# Patient Record
Sex: Male | Born: 2000 | Race: Asian | Hispanic: No | Marital: Single | Smoking: Never smoker
Health system: Southern US, Community
[De-identification: ages and names within clinical notes are randomized; demographics above are authoritative.]

## PROBLEM LIST (undated history)

## (undated) DIAGNOSIS — K219 Gastro-esophageal reflux disease without esophagitis: Secondary | ICD-10-CM

## (undated) HISTORY — PX: TONSILLECTOMY: SUR1361

---
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
## 2018-05-25 IMAGING — DX DG CHEST 2V
2 series · 2 of 2 positions shown · non-contrast
Comparison: None.

CLINICAL DATA: Chest pain

EXAM:
CHEST  2 VIEW

[x chest ap]
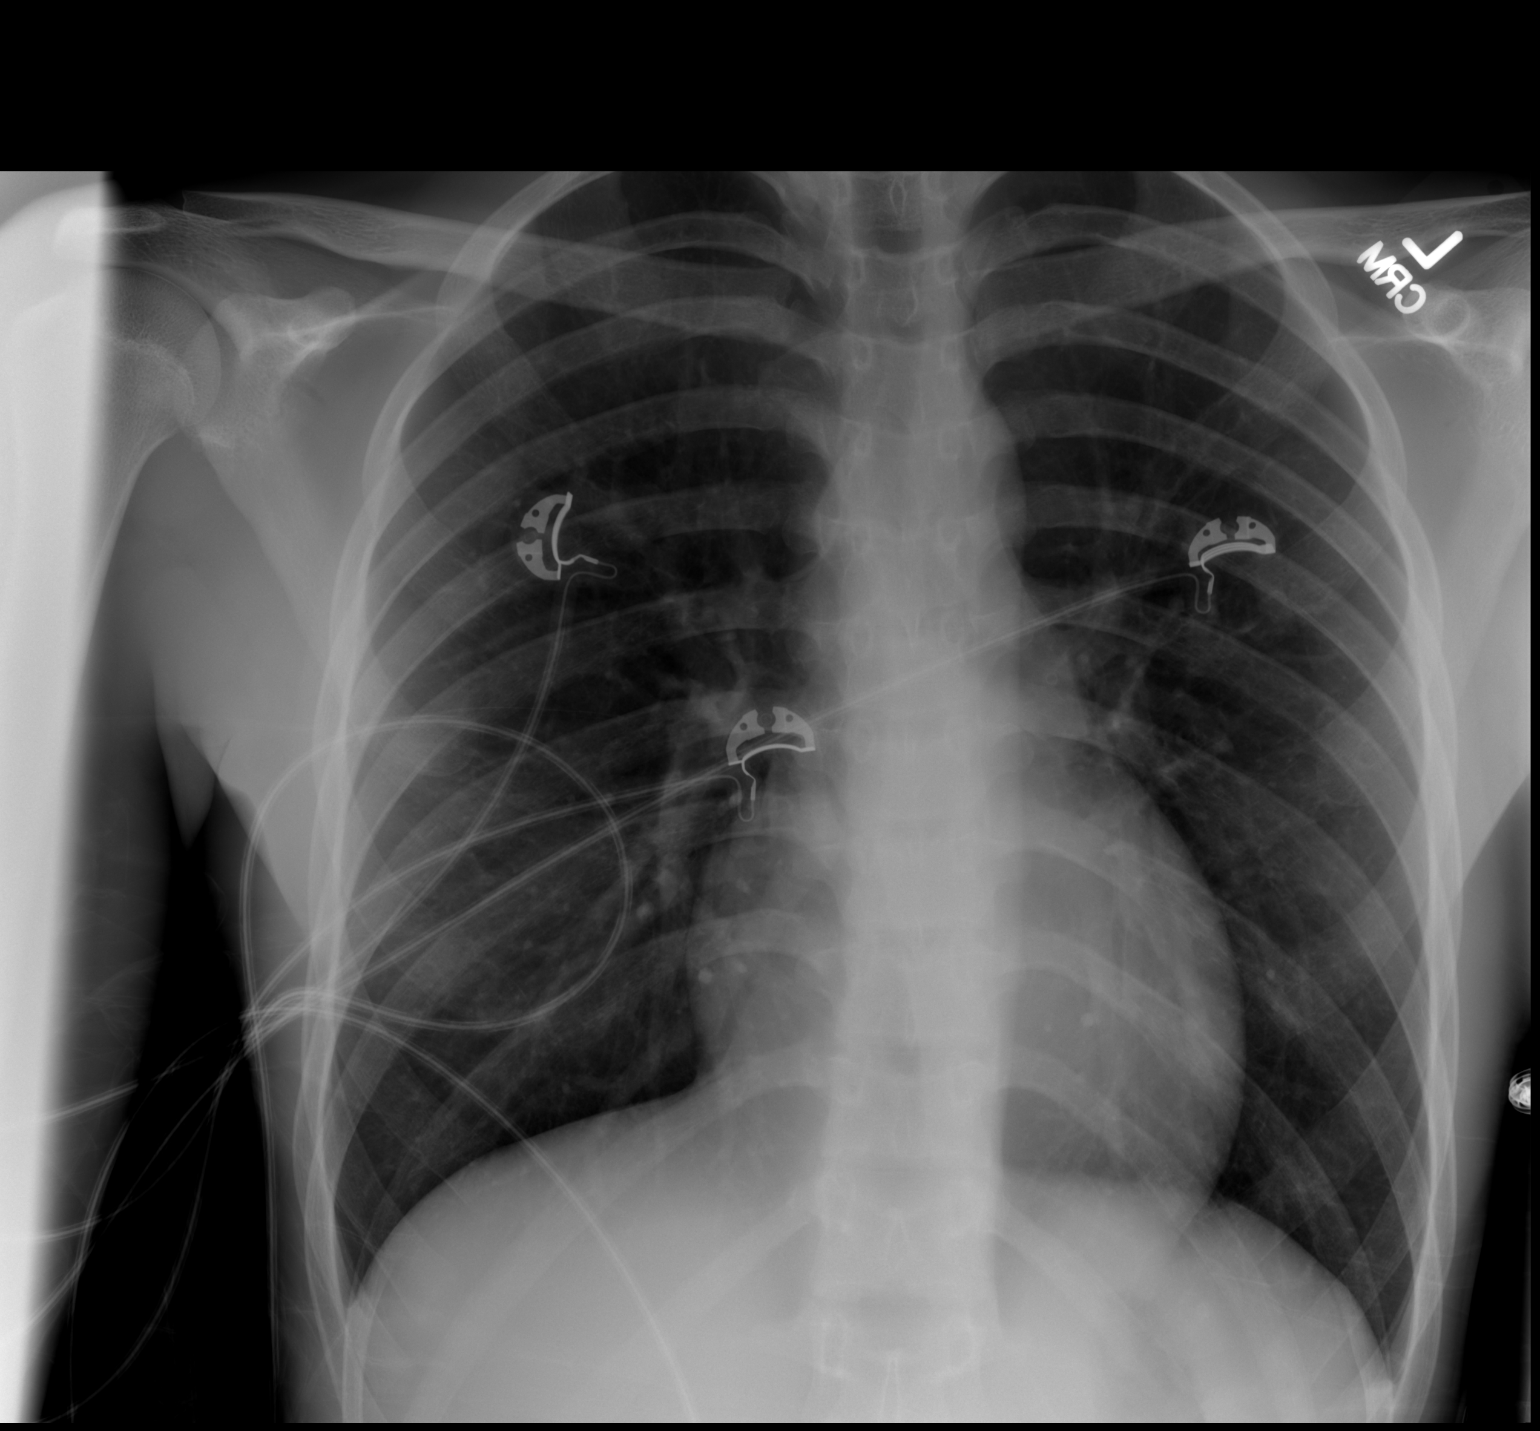

[w chest lat]
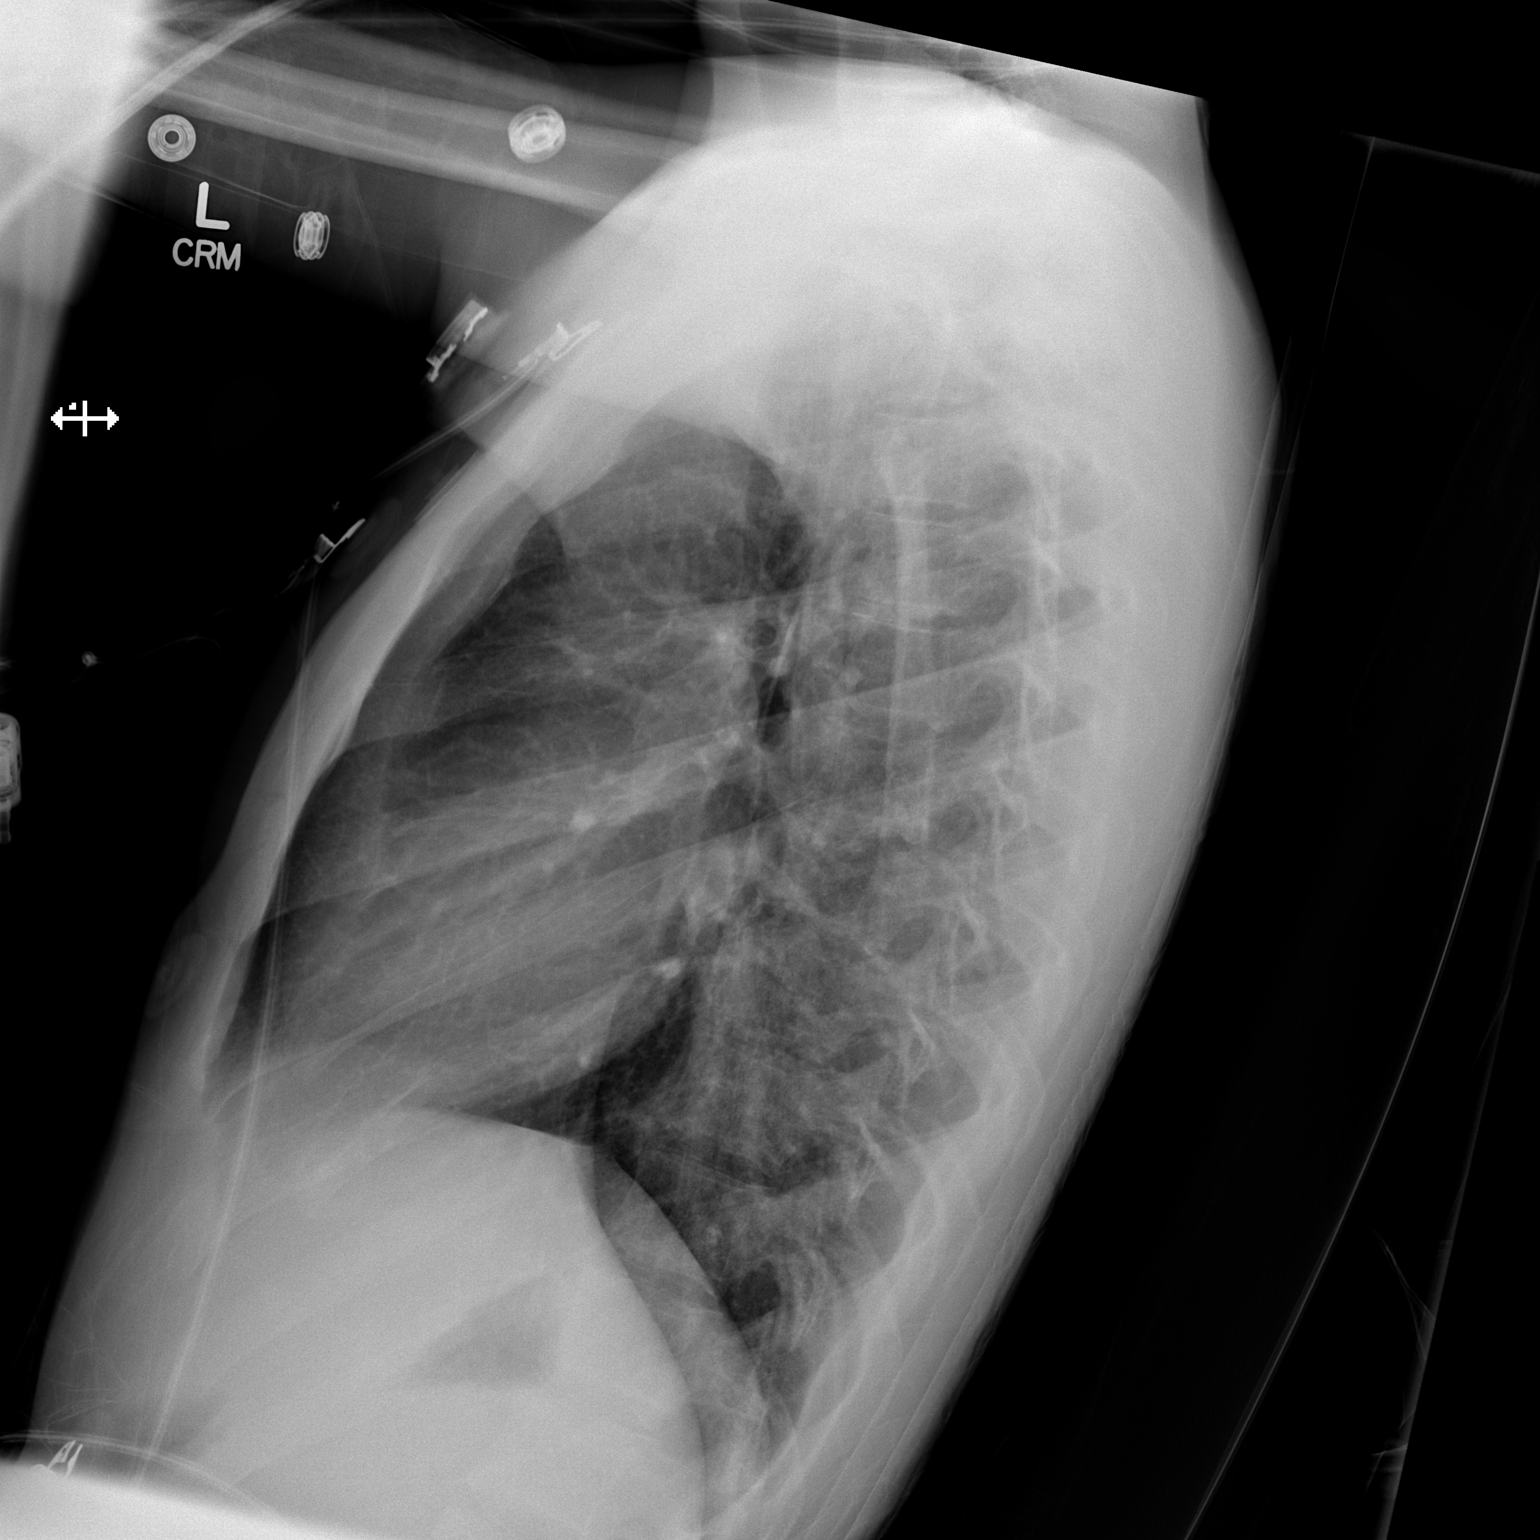

[2 of 2 positions shown; findings below may reference images not displayed]

FINDINGS: The heart size and mediastinal contours are within normal limits.
Both lungs are clear. The visualized skeletal structures are
unremarkable.
IMPRESSION: No active cardiopulmonary disease.

## 2018-07-27 ENCOUNTER — Other Ambulatory Visit: Payer: Self-pay

## 2018-07-27 DIAGNOSIS — Z20822 Contact with and (suspected) exposure to covid-19: Secondary | ICD-10-CM

## 2018-07-31 LAB — SPECIMEN STATUS REPORT

## 2018-07-31 LAB — NOVEL CORONAVIRUS, NAA: SARS-CoV-2, NAA: NOT DETECTED

## 2019-10-23 ENCOUNTER — Ambulatory Visit: Payer: Self-pay | Admitting: Family Medicine

## 2022-06-29 ENCOUNTER — Ambulatory Visit
Admission: EM | Admit: 2022-06-29 | Discharge: 2022-06-29 | Disposition: A | Discharge status: Home / Self Care | Payer: Medicaid Other | Attending: Nurse Practitioner | Admitting: Nurse Practitioner

## 2022-06-29 DIAGNOSIS — B360 Pityriasis versicolor: Secondary | ICD-10-CM

## 2022-06-29 MED ORDER — FLUCONAZOLE 200 MG PO TABS: 200.0000 mg | ORAL_TABLET | ORAL | 0 refills | Status: AC

## 2022-06-29 NOTE — ED Triage Notes (Signed)
Pt c/o rash to back x1 month that is now spreading to chest and BUE. Denies any pain or itching. Has tried ketoconazole cream without relief.

## 2022-06-29 NOTE — Discharge Instructions (Signed)
Start Diflucan once a week for 3 weeks.  You may continue topical ketoconazole cream as needed.  Please follow-up with dermatology for further treatment options.

## 2022-06-29 NOTE — ED Provider Notes (Signed)
[facility]    CSN: [id number] Arrival date & time: 06/29/22  1726      History   Chief Complaint Chief Complaint  Patient presents with   Rash    HPI Craig Odonnell is a 22 y.o. male presents for a rash. Pt reports a 1 month mildly pruritic rash that began on his back but is now traveling to his chest and neck.  States he was seen for this at another clinic and was prescribed ketoconazole cream which she has been using without improvement.  He denies any swelling, fevers, nausea/vomiting, tongue/lip/throat swelling or difficulty breathing or swallowing.  No history of eczema or psoriasis.  Does report history of tinea infections.  No other concerns at this time.   Rash   History reviewed. No pertinent past medical history.  There are no problems to display for this patient.   Past Surgical History:  Procedure Laterality Date   TONSILLECTOMY         Home Medications    Prior to Admission medications   Medication Sig Start Date End Date Taking? Authorizing Provider  fluconazole (DIFLUCAN) 200 MG tablet Take 1 tablet (200 mg total) by mouth once a week for 3 doses. 1 tablet once a week for 3 weeks 06/29/22 07/14/22 Yes Radford Pax, NP  amoxicillin (AMOXIL) 500 MG capsule Take 1 capsule (500 mg total) by mouth 3 (three) times daily. X 10 days Patient not taking: Reported on 10/05/2016 02/07/14   Ria Clock, PA  ibuprofen (ADVIL,MOTRIN) 600 MG tablet Take 1 tablet (600 mg total) by mouth every 6 (six) hours as needed. 10/05/16   Law, Waylan Boga, PA-C  Phenylephrine-Chlorphen-DM 10-09-10.5 MG/5ML LIQD Take 5 mLs by mouth every 4 (four) hours as needed. Take 2.5 ml q 4 h prn cough and cold Patient not taking: Reported on 10/05/2016 10/19/12   Hayden Rasmussen, NP  polyethylene glycol [facility]) packet Take 17 g by mouth daily. Mix in 8 oz of water and drink once daily x 5 days Patient not taking: Reported on 10/05/2016 05/15/13   Presson, Mathis Fare, PA    Family  History Family History  Problem Relation Age of Onset   Cancer Mother     Social History Social History   Tobacco Use   Smoking status: Never   Smokeless tobacco: Never  Vaping Use   Vaping Use: Every day  Substance Use Topics   Alcohol use: No   Drug use: No     Allergies   Patient has no known allergies.   Review of Systems Review of Systems  Skin:  Positive for rash.     Physical Exam Triage Vital Signs ED Triage Vitals  Enc Vitals Group     BP 06/29/22 1750 116/68     Pulse Rate 06/29/22 1748 (!) 51     Resp 06/29/22 1748 18     Temp 06/29/22 1748 97.8 F (36.6 C)     Temp Source 06/29/22 1748 Oral     SpO2 06/29/22 1748 98 %     Weight --      Height --      Head Circumference --      Peak Flow --      Pain Score 06/29/22 1746 0     Pain Loc --      Pain Edu? --      Excl. in GC? --    No data found.  Updated Vital Signs BP 116/68  Pulse (!) 51   Temp 97.8 F (36.6 C) (Oral)   Resp 18   SpO2 98%   Visual Acuity Right Eye Distance:   Left Eye Distance:   Bilateral Distance:    Right Eye Near:   Left Eye Near:    Bilateral Near:     Physical Exam Vitals and nursing note reviewed.  Constitutional:      General: He is not in acute distress.    Appearance: Normal appearance. He is not ill-appearing.  HENT:     Head: Normocephalic and atraumatic.  Eyes:     Pupils: Pupils are equal, round, and reactive to light.  Cardiovascular:     Rate and Rhythm: Normal rate.  Pulmonary:     Effort: Pulmonary effort is normal.  Skin:    General: Skin is warm and dry.     Comments: Mildly erythematous rash with central clearing noted on torso, neck and upper chest.  Neurological:     General: No focal deficit present.     Mental Status: He is alert and oriented to person, place, and time.  Psychiatric:        Mood and Affect: Mood normal.        Behavior: Behavior normal.      UC Treatments / Results  Labs (all labs ordered are listed,  but only abnormal results are displayed) Labs Reviewed - No data to display  EKG   Radiology No results found.  Procedures Procedures (including critical care time)  Medications Ordered in UC Medications - No data to display  Initial Impression / Assessment and Plan / UC Course  I have reviewed the triage vital signs and the nursing notes.  Pertinent labs & imaging results that were available during my care of the patient were reviewed by me and considered in my medical decision making (see chart for details).     Reviewed exam and symptoms with patient.  Discussed rash consistent with tinea infection.  Will start Diflucan once a week for 3 weeks and refer to dermatology.  He may continue topical ketoconazole antifungal cream daily as well.  Strongly encouraged to establish with a PCP for further workup and overall wellbeing.  ER precautions reviewed and patient verbalized understanding. Final Clinical Impressions(s) / UC Diagnoses   Final diagnoses:  Tinea versicolor     Discharge Instructions      Start Diflucan once a week for 3 weeks.  You may continue topical ketoconazole cream as needed.  Please follow-up with dermatology for further treatment options.    ED Prescriptions     Medication Sig Dispense Auth. Provider   fluconazole (DIFLUCAN) 200 MG tablet Take 1 tablet (200 mg total) by mouth once a week for 3 doses. 1 tablet once a week for 3 weeks 3 tablet Radford Pax, NP      PDMP not reviewed this encounter.   Radford Pax, NP 06/29/22 1836

## 2022-08-04 ENCOUNTER — Ambulatory Visit: Payer: Medicaid Other | Admitting: Dermatology

## 2022-10-08 ENCOUNTER — Encounter: Payer: Self-pay | Admitting: Dermatology

## 2022-10-08 ENCOUNTER — Ambulatory Visit: Payer: Medicaid Other | Admitting: Dermatology

## 2022-10-08 DIAGNOSIS — L810 Postinflammatory hyperpigmentation: Secondary | ICD-10-CM | POA: Diagnosis not present

## 2022-10-08 DIAGNOSIS — B360 Pityriasis versicolor: Secondary | ICD-10-CM

## 2022-10-08 DIAGNOSIS — L700 Acne vulgaris: Secondary | ICD-10-CM

## 2022-10-08 MED ORDER — TRETINOIN 0.025 % EX CREA
TOPICAL_CREAM | Freq: Every day | CUTANEOUS | 6 refills | Status: DC
Start: 2022-10-08 — End: 2023-02-04

## 2022-10-08 MED ORDER — KETOCONAZOLE 2 % EX CREA: 1.0000 | TOPICAL_CREAM | Freq: Two times a day (BID) | CUTANEOUS | 4 refills | Status: AC

## 2022-10-08 NOTE — Progress Notes (Signed)
   New Patient Visit   Subjective  Craig Odonnell is a 22 y.o. male who presents for the following: Tinea Versicolor and Acne  Patient states he has rash located at the chest and back that he  would like to have examined. Patient reports the areas have been there for 4 months. He reports the areas are not bothersome.Patient rates irritation 0 out of 10. He states that the areas have spread. Patient reports he  has not previously been treated for these areas. Patient denies Hx of bx. Patient denies family history of skin cancer(s).  The following portions of the chart were reviewed this encounter and updated as appropriate: medications, allergies, medical history  Review of Systems:  No other skin or systemic complaints except as noted in HPI or Assessment and Plan.  Objective  Well appearing patient in no apparent distress; mood and affect are within normal limits.  A focused examination was performed of the following areas: Chest and Back  Relevant exam findings are noted in the Assessment and Plan.    Assessment & Plan   Tinea Versicolor  Flared  - Apply Ketoconazole cream twice a day as needed until clear. Advised it can take many months for affected areas to re pigment - Recommended - Use ketoconazole cream once a month, especially in summer months   Tinea versicolor is a chronic recurrent skin rash causing discolored scaly spots most commonly seen on back, chest, and/or shoulders.  It is generally asymptomatic. The rash is due to overgrowth of a common type of yeast present on everyone's skin and it is not contagious.  It tends to flare more in the summer due to increased sweating on trunk.  After rash is treated, the scaliness will resolve, but the discoloration will take longer to return to normal pigmentation. The periodic use of an OTC medicated soap/shampoo with zinc or selenium sulfide can be helpful to prevent yeast overgrowth and recurrence.  POST-INFLAMMATORY  HYPERPIGMENTATION (PIH) Exam: hyperpigmented macules and/or patches at face   This is a benign condition that comes from having previous inflammation in the skin and will fade with time over months to sometimes years. Recommend daily sun protection including sunscreen SPF 30+ to sun-exposed areas. - Recommend treating any itchy or red areas on the skin quickly to prevent new areas of PIH. Treating with prescription medicines such as hydroquinone may help fade dark spots faster.    Treatment Plan:  - Tretinonin 0.25% to apply nightly (M-W-F)  - We will follow up in 6 months to reassess    No follow-ups on file.    Documentation: I have reviewed the above documentation for accuracy and completeness, and I agree with the above.  Stasia Cavalier, am acting as scribe for Langston Reusing, DO.   Langston Reusing, DO

## 2022-10-08 NOTE — Patient Instructions (Addendum)

## 2022-11-11 ENCOUNTER — Ambulatory Visit: Payer: Medicaid Other | Admitting: Dermatology

## 2022-11-11 ENCOUNTER — Encounter: Payer: Self-pay | Admitting: Dermatology

## 2022-11-11 VITALS — BP 114/76 | HR 60

## 2022-11-11 DIAGNOSIS — D239 Other benign neoplasm of skin, unspecified: Secondary | ICD-10-CM

## 2022-11-11 DIAGNOSIS — B070 Plantar wart: Secondary | ICD-10-CM

## 2022-11-11 MED ORDER — SAFETY SEAL MISCELLANEOUS MISC: 1.0000 | Freq: Every evening | 2 refills | Status: AC

## 2022-11-11 NOTE — Patient Instructions (Addendum)
Hello Twan,  Thank you for visiting my office today. Your proactive approach towards your health is greatly appreciated. Here is a summary of our discussion and the established treatment plan:  - Intradermal Nevus on Nose:   - Diagnosis: Benign intradermal nevus.   - Consultation: Discussed potential removal options and the associated risks of scarring.   - Decision: Deferred removal to monitor for any changes in the future.  - Warts on Right Great Toe:   - Treatment Administered Today: Cryotherapy with liquid nitrogen and Cantharone application.   - Instructions:     - Wash off Cantharone by 3 PM today.     - Apply Aquaphor and a Band-Aid for the next week.     - Start using Wart-Pad medication nightly for six weeks, which will be mailed to you by [facility].  - Follow-Up:   - We will schedule a follow-up appointment in six weeks to assess the treatment progress of the warts.  Please do not hesitate to reach out if you have any questions or concerns regarding your treatment plan.  Warm regards,  Dr. Langston Reusing, Dermatology    Cryotherapy Aftercare  Wash gently with soap and water everyday.   Apply Vaseline and Band-Aid daily until healed.    Important Information   Due to recent changes in healthcare laws, you may see results of your pathology and/or laboratory studies on MyChart before the doctors have had a chance to review them. We understand that in some cases there may be results that are confusing or concerning to you. Please understand that not all results are received at the same time and often the doctors may need to interpret multiple results in order to provide you with the best plan of care or course of treatment. Therefore, we ask that you please give [place] 2 business days to thoroughly review all your results before contacting the office for clarification. Should we see a critical lab result, you will be contacted sooner.     If You Need Anything After  Your Visit   If you have any questions or concerns for your doctor, please call our main line at [phone]. If no one answers, please leave a voicemail as directed and we will return your call as soon as possible. Messages left after 4 pm will be answered the following business day.    You may also send [place] a message via MyChart. We typically respond to MyChart messages within 1-2 business days.  For prescription refills, please ask your pharmacy to contact our office. Our fax number is [phone].  If you have an urgent issue when the clinic is closed that cannot wait until the next business day, you can page your doctor at the number below.     Please note that while we do our best to be available for urgent issues outside of office hours, we are not available 24/7.    If you have an urgent issue and are unable to reach [place], you may choose to seek medical care at your doctor's office, retail clinic, urgent care center, or emergency room.   If you have a medical emergency, please immediately call 911 or go to the emergency department. In the event of inclement weather, please call our main line at [phone] for an update on the status of any delays or closures.  Dermatology Medication Tips: Please keep the boxes that topical medications come in in order to help keep track of the instructions about where and  how to use these. Pharmacies typically print the medication instructions only on the boxes and not directly on the medication tubes.   If your medication is too expensive, please contact our office at [phone] or send [place] a message through MyChart.    We are unable to tell what your co-pay for medications will be in advance as this is different depending on your insurance coverage. However, we may be able to find a substitute medication at lower cost or fill out paperwork to get insurance to cover a needed medication.    If a prior authorization is required to get your medication  covered by your insurance company, please allow [place] 1-2 business days to complete this process.   Drug prices often vary depending on where the prescription is filled and some pharmacies may offer cheaper prices.   The website www.goodrx.com contains coupons for medications through different pharmacies. The prices here do not account for what the cost may be with help from insurance (it may be cheaper with your insurance), but the website can give you the price if you did not use any insurance.  - You can print the associated coupon and take it with your prescription to the pharmacy.  - You may also stop by our office during regular business hours and pick up a GoodRx coupon card.  - If you need your prescription sent electronically to a different pharmacy, notify our office through [facility] or by phone at [phone]

## 2022-11-11 NOTE — Progress Notes (Signed)
   Follow-Up Visit   Subjective  Craig Odonnell is a 22 y.o. male who presents for the following: Bump on Nose & Wart  Patient present today for follow up visit for Bump on Nose. Patient was last evaluated on 10/08/22. Patient reports sxs have  improved . Patient denies medication changes.  Patient has a wart on the right foot. He state the area has been there for about 3 years. He has tried treating with otc wart freezing but nothing has seemed to help.  The following portions of the chart were reviewed this encounter and updated as appropriate: medications, allergies, medical history  Review of Systems:  No other skin or systemic complaints except as noted in HPI or Assessment and Plan.  Objective  Well appearing patient in no apparent distress; mood and affect are within normal limits.  A focused examination was performed of the following areas: Nose and Right foot  Relevant exam findings are noted in the Assessment and Plan.  Right Hallux Medial Paronychium of Toe, Right Hallux Metatarsophalangeal Joint, Right Hallux Proximal Dorsal Toe Verrucous papules        Assessment & Plan   Intradermal NEVI Exam: Tan-brown and/or pink-flesh-colored symmetric papules  Treatment Plan:  - Benign appearing on exam today  - Recommend observation  - Call clinic for new or changing moles - Recommend daily use of broad spectrum spf 30+ sunscreen to sun-exposed areas.   Plantar wart (3) Right Hallux Metatarsophalangeal Joint; Right Hallux Proximal Dorsal Toe; Right Hallux Medial Paronychium of Toe  WART Exam: Verrucous papule(s)  Counseling Discussed viral / HPV (Human Papilloma Virus) etiology and risk of spread /infectivity to other areas of body as well as to other people.  Multiple treatments and methods may be required to clear warts and it is possible treatment may not be successful.  Treatment risks include discoloration; scarring and there is still potential for wart  recurrence.   Destruction of lesion - Right Hallux Medial Paronychium of Toe, Right Hallux Metatarsophalangeal Joint, Right Hallux Proximal Dorsal Toe Complexity: simple   Destruction method: cryotherapy   Informed consent: discussed and consent obtained   Timeout:  patient name, date of birth, surgical site, and procedure verified Lesion destroyed using liquid nitrogen: Yes   Cryotherapy cycles:  4 Post-procedure details: wound care instructions given    Destruction of lesion - Right Hallux Medial Paronychium of Toe, Right Hallux Metatarsophalangeal Joint, Right Hallux Proximal Dorsal Toe  Destruction method: chemical removal   Chemical destruction method: cantharidin   Application time:  15 minutes Outcome: patient tolerated procedure well with no complications   Post-procedure details: wound care instructions given   Additional details:  WASH OFF IN 4 HOURS (3 pm Today)  Related Medications Safety Seal Miscellaneous MISC Apply 1 Application topically at bedtime. Medication Name: G pen  Return in about 6 weeks (around 12/23/2022) for Wart F/U.  Documentation: I have reviewed the above documentation for accuracy and completeness, and I agree with the above.  Stasia Cavalier, am acting as scribe for Langston Reusing, DO.  Langston Reusing, DO

## 2022-12-24 ENCOUNTER — Encounter: Payer: Self-pay | Admitting: Dermatology

## 2022-12-24 ENCOUNTER — Ambulatory Visit: Payer: Medicaid Other | Admitting: Dermatology

## 2022-12-24 VITALS — BP 116/73 | HR 65

## 2022-12-24 DIAGNOSIS — B070 Plantar wart: Secondary | ICD-10-CM | POA: Diagnosis not present

## 2022-12-24 NOTE — Progress Notes (Signed)
   Follow-Up Visit   Subjective  Craig Odonnell is a 22 y.o. male who presents for the following: wart on right toe Pt her for f/u from treatment of wart on his right toe 11/11/22. He was treated with LN2 and cantharidin and told to get a Gpen to treat at home. He has been using the Gpen almost every day. He feels he has had improvement with 1 wart resolved and the other 2 smaller.   The following portions of the chart were reviewed this encounter and updated as appropriate: medications, allergies, medical history  Review of Systems:  No other skin or systemic complaints except as noted in HPI or Assessment and Plan.  Objective  Well appearing patient in no apparent distress; mood and affect are within normal limits.  A focused examination was performed of the following areas: Nose and right toe  Relevant exam findings are noted in the Assessment and Plan.   Exam: verrucous papule(s) on right medial great toe    Assessment & Plan   WART  Counseling Discussed viral / HPV (Human Papilloma Virus) etiology and risk of spread /infectivity to other areas of body as well as to other people.  Multiple treatments and methods may be required to clear warts and it is possible treatment may not be successful.  Treatment risks include discoloration; scarring and there is still potential for wart recurrence.  Treatment Plan: -Continue Gpen nightly and cover   PLANTAR WART (3) Right great toe (3) Destruction of lesion - Right great toe (3)  Destruction method: chemical removal   Chemical destruction method: cantharidin   Procedure instructions: patient instructed to wash and dry area   Procedure instructions comment:  Wash off in 4 hours Related Medications Safety Seal Miscellaneous MISC Apply 1 Application topically at bedtime. Medication Name: G pen  Return in about 6 weeks (around 02/04/2023) for wart f/u.  Owens Shark, CMA, am acting as scribe for [employer], DO.    Documentation: I have reviewed the above documentation for accuracy and completeness, and I agree with the above.  Langston Reusing, DO

## 2022-12-24 NOTE — Patient Instructions (Addendum)
Dear Craig Odonnell,  Thank you for visiting my office today. Your dedication to addressing your dermatological concerns and improving your health is greatly appreciated. Here is a summary of the key instructions from today's consultation:  - Wart Treatment: Continue using the wart pen every night. Ensure to cover the area afterward.  Warts on Right Great Toe:   - We will freeze the area again today.   - Application: Apply cantharidin and leave it on for 4.5 hours before washing it off.   - If the area becomes white and mushy, use an Ducor board to gently remove dead skin.   - Important: Ensure to wash your hands after handling the treated area to prevent spreading the virus.  - Immune Response Enhancement: Start taking cimetidine (over-the-counter, active ingredient in Tagamet) once daily. This is to boost your immune response against the wart virus.   - Duration: Continue this treatment for 3-4 months, and for one additional month after the warts appear to be gone.  - Follow-Up: We have scheduled a follow-up appointment in 4 to 6 weeks to assess progress.  Please ensure to follow these instructions carefully. If you have any questions or concerns, do not hesitate to reach out. Wishing you a merry Christmas and a happy new year!  Warm regards,  Dr. Langston Reusing Dermatology     Important Information   Due to recent changes in healthcare laws, you may see results of your pathology and/or laboratory studies on MyChart before the doctors have had a chance to review them. We understand that in some cases there may be results that are confusing or concerning to you. Please understand that not all results are received at the same time and often the doctors may need to interpret multiple results in order to provide you with the best plan of care or course of treatment. Therefore, we ask that you please give [place] 2 business days to thoroughly review all your results before contacting the office for  clarification. Should we see a critical lab result, you will be contacted sooner.     If You Need Anything After Your Visit   If you have any questions or concerns for your doctor, please call our main line at [phone]. If no one answers, please leave a voicemail as directed and we will return your call as soon as possible. Messages left after 4 pm will be answered the following business day.    You may also send [place] a message via MyChart. We typically respond to MyChart messages within 1-2 business days.  For prescription refills, please ask your pharmacy to contact our office. Our fax number is [phone].  If you have an urgent issue when the clinic is closed that cannot wait until the next business day, you can page your doctor at the number below.     Please note that while we do our best to be available for urgent issues outside of office hours, we are not available 24/7.    If you have an urgent issue and are unable to reach [place], you may choose to seek medical care at your doctor's office, retail clinic, urgent care center, or emergency room.   If you have a medical emergency, please immediately call 911 or go to the emergency department. In the event of inclement weather, please call our main line at [phone] for an update on the status of any delays or closures.  Dermatology Medication Tips: Please keep the boxes that topical medications come  in in order to help keep track of the instructions about where and how to use these. Pharmacies typically print the medication instructions only on the boxes and not directly on the medication tubes.   If your medication is too expensive, please contact our office at [phone] or send [place] a message through MyChart.    We are unable to tell what your co-pay for medications will be in advance as this is different depending on your insurance coverage. However, we may be able to find a substitute medication at lower cost or fill out  paperwork to get insurance to cover a needed medication.    If a prior authorization is required to get your medication covered by your insurance company, please allow [place] 1-2 business days to complete this process.   Drug prices often vary depending on where the prescription is filled and some pharmacies may offer cheaper prices.   The website www.goodrx.com contains coupons for medications through different pharmacies. The prices here do not account for what the cost may be with help from insurance (it may be cheaper with your insurance), but the website can give you the price if you did not use any insurance.  - You can print the associated coupon and take it with your prescription to the pharmacy.  - You may also stop by our office during regular business hours and pick up a GoodRx coupon card.  - If you need your prescription sent electronically to a different pharmacy, notify our office through [facility] or by phone at [phone]

## 2023-02-04 ENCOUNTER — Ambulatory Visit: Payer: Medicaid Other | Admitting: Dermatology

## 2023-02-04 ENCOUNTER — Encounter: Payer: Self-pay | Admitting: Dermatology

## 2023-02-04 VITALS — BP 110/73 | HR 66

## 2023-02-04 DIAGNOSIS — L700 Acne vulgaris: Secondary | ICD-10-CM

## 2023-02-04 DIAGNOSIS — B070 Plantar wart: Secondary | ICD-10-CM

## 2023-02-04 DIAGNOSIS — B079 Viral wart, unspecified: Secondary | ICD-10-CM | POA: Diagnosis not present

## 2023-02-04 DIAGNOSIS — D485 Neoplasm of uncertain behavior of skin: Secondary | ICD-10-CM

## 2023-02-04 DIAGNOSIS — R21 Rash and other nonspecific skin eruption: Secondary | ICD-10-CM

## 2023-02-04 DIAGNOSIS — D492 Neoplasm of unspecified behavior of bone, soft tissue, and skin: Secondary | ICD-10-CM

## 2023-02-04 MED ORDER — TRETINOIN 0.05 % EX CREA
TOPICAL_CREAM | Freq: Every day | CUTANEOUS | 3 refills | Status: AC
Start: 2023-02-04 — End: 2024-02-04

## 2023-02-04 NOTE — Patient Instructions (Addendum)
Hello Craig Odonnell,  Thank you for visiting my office today. Your dedication to addressing your dermatological concerns and improving your health is greatly appreciated. Here is a summary of the key instructions from today's consultation:  Wart Treatment:   G-Pen Usage: Continue applying the G-Pen every night to your feet.   Medication: Take cimetidine as prescribed.   Procedure Today: We applied liquid nitrogen and cantharone. Keep the cantharone on for 4 hours, then wash it off and apply Vaseline.   Post-Treatment: After a week, resume using the G-Pen.  Facial Care:   Tretinoin Adjustment: Increase tretinoin from 0.025 to 0.05. Start with 2 nights a week for 2 weeks, then 3 nights a week. Aim to use it every other night by May or June.   Hyaluronic Acid: Incorporate hyaluronic acid into your routine to mitigate dryness. It is available at [employer]. Apply it after washing your face, before tretinoin, and follow with moisturizer.   Biopsy Site Care: Avoid applying tretinoin on the biopsy site. Use cortisone cream as prescribed to soothe the area.  Biopsy Procedure:   Procedure Performed: A shave biopsy was performed today to investigate the nature of the facial rash.   Aftercare: Apply Aquaphor, keep the area clean, and refrain from using tretinoin on the biopsy site. Apply Aquaphor twice daily.   Results: The results will be communicated via MyChart.  Follow-Up:   Next Appointment: Please schedule a follow-up appointment in 8 weeks to evaluate your foot and tretinoin progress.  Your cooperation and adherence to the treatment plan are crucial for the best outcomes. If you have any questions or concerns before your next visit, please do not hesitate to contact our office.  Warm regards,  Dr. Langston Reusing Dermatology   Patient Handout: Wound Care for Skin Biopsy Site  Taking Care of Your Skin Biopsy Site  Proper care of the biopsy site is essential for promoting healing and minimizing  scarring. This handout provides instructions on how to care for your biopsy site to ensure optimal recovery.  1. Cleaning the Wound:  Clean the biopsy site daily with gentle soap and water. Gently pat the area dry with a clean, soft towel. Avoid harsh scrubbing or rubbing the area, as this can irritate the skin and delay healing.  2. Applying Aquaphor and Bandage:  After cleaning the wound, apply a thin layer of Aquaphor ointment to the biopsy site. Cover the area with a sterile bandage to protect it from dirt, bacteria, and friction. Change the bandage daily or as needed if it becomes soiled or wet.  3. Continued Care for One Week:  Repeat the cleaning, Aquaphor application, and bandaging process daily for one week following the biopsy procedure. Keeping the wound clean and moist during this initial healing period will help prevent infection and promote optimal healing.  4. Massaging Aquaphor into the Area:  ---After one week, discontinue the use of bandages but continue to apply Aquaphor to the biopsy site. ----Gently massage the Aquaphor into the area using circular motions. ---Massaging the skin helps to promote circulation and prevent the formation of scar tissue.   Additional Tips:  Avoid exposing the biopsy site to direct sunlight during the healing process, as this can cause hyperpigmentation or worsen scarring. If you experience any signs of infection, such as increased redness, swelling, warmth, or drainage from the wound, contact your healthcare provider immediately. Follow any additional instructions provided by your healthcare provider for caring for the biopsy site and managing any discomfort. Conclusion:  Taking proper care of your skin biopsy site is crucial for ensuring optimal healing and minimizing scarring. By following these instructions for cleaning, applying Aquaphor, and massaging the area, you can promote a smooth and successful recovery. If you have any  questions or concerns about caring for your biopsy site, don't hesitate to contact your healthcare provider for guidance.    [place] Hyaluronic Acid:         Important Information   Due to recent changes in healthcare laws, you may see results of your pathology and/or laboratory studies on MyChart before the doctors have had a chance to review them. We understand that in some cases there may be results that are confusing or concerning to you. Please understand that not all results are received at the same time and often the doctors may need to interpret multiple results in order to provide you with the best plan of care or course of treatment. Therefore, we ask that you please give [place] 2 business days to thoroughly review all your results before contacting the office for clarification. Should we see a critical lab result, you will be contacted sooner.     If You Need Anything After Your Visit   If you have any questions or concerns for your doctor, please call our main line at [phone]. If no one answers, please leave a voicemail as directed and we will return your call as soon as possible. Messages left after 4 pm will be answered the following business day.    You may also send [place] a message via MyChart. We typically respond to MyChart messages within 1-2 business days.  For prescription refills, please ask your pharmacy to contact our office. Our fax number is [phone].  If you have an urgent issue when the clinic is closed that cannot wait until the next business day, you can page your doctor at the number below.     Please note that while we do our best to be available for urgent issues outside of office hours, we are not available 24/7.    If you have an urgent issue and are unable to reach [place], you may choose to seek medical care at your doctor's office, retail clinic, urgent care center, or emergency room.   If you have a medical emergency, please immediately call 911 or go  to the emergency department. In the event of inclement weather, please call our main line at [phone] for an update on the status of any delays or closures.  Dermatology Medication Tips: Please keep the boxes that topical medications come in in order to help keep track of the instructions about where and how to use these. Pharmacies typically print the medication instructions only on the boxes and not directly on the medication tubes.   If your medication is too expensive, please contact our office at [phone] or send [place] a message through MyChart.    We are unable to tell what your co-pay for medications will be in advance as this is different depending on your insurance coverage. However, we may be able to find a substitute medication at lower cost or fill out paperwork to get insurance to cover a needed medication.    If a prior authorization is required to get your medication covered by your insurance company, please allow [place] 1-2 business days to complete this process.   Drug prices often vary depending on where the prescription is filled and some pharmacies may offer cheaper prices.   The website  www.goodrx.com contains coupons for medications through different pharmacies. The prices here do not account for what the cost may be with help from insurance (it may be cheaper with your insurance), but the website can give you the price if you did not use any insurance.  - You can print the associated coupon and take it with your prescription to the pharmacy.  - You may also stop by our office during regular business hours and pick up a GoodRx coupon card.  - If you need your prescription sent electronically to a different pharmacy, notify our office through [facility] or by phone at [phone]

## 2023-02-04 NOTE — Progress Notes (Signed)
Follow-Up Visit   Subjective  Craig Odonnell is a 23 y.o. male who presents for the following: Wart and Facial irratation  Patient present today for follow up visit for Wart. Patient was last evaluated on 12/24/22. At this visit he was treated with LN2 and cantharidin. He was recommended taking daily cimetidine & prescribed a Gpen to treat at home nightly. He reports he has not been using the Gpen nightly and he has not gotten otc Cimetidine. He feels he has had Patient reports sxs are unchanged. Patient denies medication changes.  He also has concerns about a facial rash that has appeared over the past week. He states the are is itchy and he has not tried to put any medication on the area.   The following portions of the chart were reviewed this encounter and updated as appropriate: medications, allergies, medical history  Review of Systems:  No other skin or systemic complaints except as noted in HPI or Assessment and Plan.  Objective  Well appearing patient in no apparent distress; mood and affect are within normal limits.  A focused examination was performed of the following areas: Right Great Toe and Face  Relevant exam findings are noted in the Assessment and Plan.                    Right Eyebrow Flat top Verrua like papaule with Crusting Right Hallux Medial Paronychium of Toe, Right Hallux Proximal Dorsal Toe Verrucous papules   Assessment & Plan    Plantar Warts on Right Great Toe Assessment: Patient presents with persistent plantar warts on the right great toe. Previous treatments have shown partial success, with one wart resolving but a new one appearing. The stubborn nature of these lesions is noted. Plan: Apply cryotherapy and cantharone to 3 warts on the right great toe. Leave cantharone on for 4 hours, then wash off and apply Vaseline. Continue nightly application of G-Pen (salicylic acid) after 1 week. Take oral cimetidine (dose not specified). Follow up in  8 weeks to reassess foot condition.  Acne Vulgaris Assessment: Patient reports improvement in acne with current tretinoin 0.025% treatment. Mild dryness noted as a side effect.  Plan: Increase tretinoin from 0.025% to 0.05%. Initiate new tretinoin regimen: 2 nights/week for 2 weeks, then 3 nights/week until May/June, then every other night. Incorporate hyaluronic acid into skincare routine (recommend [employer] brand).   Apply hyaluronic acid before tretinoin, followed by moisturizer. Use hyaluronic acid and moisturizer on non-tretinoin nights. Follow up in 8 weeks to assess tretinoin progress.  Facial Rash (Suspected Warts vs Syringomas) Assessment: Patient presents with itchy lesions on face, possibly related to eyebrow threading. Differential diagnosis includes irritation, warts, or syringomas.  Plan: Perform shave biopsy of facial lesion. Avoid applying tretinoin to biopsy area. Apply cortisone cream to affected area (strength and frequency not specified). Biopsy aftercare: keep area clean, apply Aquaphor twice daily. Send biopsy results via MyChart. Discontinue tretinoin use on biopsy site.  NEOPLASM OF UNCERTAIN BEHAVIOR OF SKIN Right Eyebrow Skin / nail biopsy Type of biopsy: tangential   Informed consent: discussed and consent obtained   Timeout: patient name, date of birth, surgical site, and procedure verified   Procedure prep:  Patient was prepped and draped in usual sterile fashion Prep type:  Isopropyl alcohol Anesthesia: the lesion was anesthetized in a standard fashion   Anesthetic:  1% lidocaine w/ epinephrine 1-100,000 buffered w/ 8.4% NaHCO3 Instrument used: DermaBlade   Hemostasis achieved with: aluminum chloride   Outcome:  patient tolerated procedure well   Post-procedure details: sterile dressing applied and wound care instructions given   Dressing type: petrolatum gauze and bandage   Specimen A - Surgical pathology Differential Diagnosis: Wart vs intertrigo vs  Syringoma  Check Margins: No VIRAL WARTS, UNSPECIFIED TYPE (2) Right Hallux Medial Paronychium of Toe, Right Hallux Proximal Dorsal Toe Destruction of lesion - Right Hallux Medial Paronychium of Toe, Right Hallux Proximal Dorsal Toe Complexity: simple   Destruction method: cryotherapy   Informed consent: discussed and consent obtained   Timeout:  patient name, date of birth, surgical site, and procedure verified Lesion destroyed using liquid nitrogen: Yes   Cryotherapy cycles:  2 Post-procedure details: wound care instructions given    Destruction of lesion - Right Hallux Medial Paronychium of Toe, Right Hallux Proximal Dorsal Toe  Destruction method: chemical removal   Chemical destruction method: cantharidin   Application time:  3 seconds Additional details:  Patient instructed to wash off in 3:45 pm  Return in about 8 weeks (around 04/01/2023).  Documentation: I have reviewed the above documentation for accuracy and completeness, and I agree with the above.  Stasia Cavalier, am acting as scribe for Langston Reusing, DO.  Langston Reusing, DO

## 2023-02-09 LAB — SURGICAL PATHOLOGY

## 2023-02-10 ENCOUNTER — Telehealth: Payer: Self-pay

## 2023-02-10 DIAGNOSIS — B078 Other viral warts: Secondary | ICD-10-CM

## 2023-02-10 MED ORDER — IMIQUIMOD 5 % EX CREA: TOPICAL_CREAM | CUTANEOUS | 2 refills | Status: AC

## 2023-02-10 NOTE — Telephone Encounter (Signed)
-----   Message from Delon Lenis sent at 02/09/2023  5:28 PM EST ----- Hi Craig Odonnell,  Please call pt and let him know the bx results came back as a wart.  Please let him know you will be sending over a prescription for imiquimod  5% for him to apply a thin layer to the affected area at bedtime until his next follow up appointment.  Thanks!  Diagnosis Skin , right eyebrow VERRUCA VULGARIS, INFLAMED

## 2023-02-10 NOTE — Telephone Encounter (Signed)
 Pt could not discuss at the time of call. He will call back shortly.   Rx sent.

## 2023-04-01 ENCOUNTER — Ambulatory Visit: Payer: Medicaid Other | Admitting: Dermatology

## 2023-04-01 ENCOUNTER — Encounter: Payer: Self-pay | Admitting: Dermatology

## 2023-04-01 VITALS — BP 133/87

## 2023-04-01 DIAGNOSIS — B079 Viral wart, unspecified: Secondary | ICD-10-CM

## 2023-04-01 NOTE — Patient Instructions (Addendum)
 Wash off cantheroin in 3hours.   Cryotherapy Aftercare  Wash gently with soap and water everyday.   Apply Vaseline and Band-Aid daily until healed.   Important Information  Due to recent changes in healthcare laws, you may see results of your pathology and/or laboratory studies on MyChart before the doctors have had a chance to review them. We understand that in some cases there may be results that are confusing or concerning to you. Please understand that not all results are received at the same time and often the doctors may need to interpret multiple results in order to provide you with the best plan of care or course of treatment. Therefore, we ask that you please give [place] 2 business days to thoroughly review all your results before contacting the office for clarification. Should we see a critical lab result, you will be contacted sooner.   If You Need Anything After Your Visit  If you have any questions or concerns for your doctor, please call our main line at [phone] If no one answers, please leave a voicemail as directed and we will return your call as soon as possible. Messages left after 4 pm will be answered the following business day.   You may also send [place] a message via MyChart. We typically respond to MyChart messages within 1-2 business days.  For prescription refills, please ask your pharmacy to contact our office. Our fax number is [phone].  If you have an urgent issue when the clinic is closed that cannot wait until the next business day, you can page your doctor at the number below.    Please note that while we do our best to be available for urgent issues outside of office hours, we are not available 24/7.   If you have an urgent issue and are unable to reach [place], you may choose to seek medical care at your doctor's office, retail clinic, urgent care center, or emergency room.  If you have a medical emergency, please immediately call 911 or go to the emergency  department. In the event of inclement weather, please call our main line at [phone] for an update on the status of any delays or closures.  Dermatology Medication Tips: Please keep the boxes that topical medications come in in order to help keep track of the instructions about where and how to use these. Pharmacies typically print the medication instructions only on the boxes and not directly on the medication tubes.   If your medication is too expensive, please contact our office at [phone] or send [place] a message through MyChart.   We are unable to tell what your co-pay for medications will be in advance as this is different depending on your insurance coverage. However, we may be able to find a substitute medication at lower cost or fill out paperwork to get insurance to cover a needed medication.   If a prior authorization is required to get your medication covered by your insurance company, please allow [place] 1-2 business days to complete this process.  Drug prices often vary depending on where the prescription is filled and some pharmacies may offer cheaper prices.  The website www.goodrx.com contains coupons for medications through different pharmacies. The prices here do not account for what the cost may be with help from insurance (it may be cheaper with your insurance), but the website can give you the price if you did not use any insurance.  - You can print the associated coupon and take it with  your prescription to the pharmacy.  - You may also stop by our office during regular business hours and pick up a GoodRx coupon card.  - If you need your prescription sent electronically to a different pharmacy, notify our office through [facility] or by phone at [phone]

## 2023-04-01 NOTE — Progress Notes (Signed)
   Follow-Up Visit   Subjective  Craig Odonnell is a 23 y.o. male who presents for the following: wart  Patient present today for follow up visit for . Patient was last evaluated on 02/04/23. At this visit patient was prescribed wart pen & imiquimod cream. Patient reports sxs are better. Patient denies medication changes.  The following portions of the chart were reviewed this encounter and updated as appropriate: medications, allergies, medical history  Review of Systems:  No other skin or systemic complaints except as noted in HPI or Assessment and Plan.  Objective  Well appearing patient in no apparent distress; mood and affect are within normal limits.   A focused examination was performed of the following areas: toe   Relevant exam findings are noted in the Assessment and Plan.       Right Hallux Medial Paronychium of Toe, Right Hallux Proximal Dorsal Toe Verrucous papules   Assessment & Plan   WART Exam: verrucous papule(s)  Counseling Discussed viral / HPV (Human Papilloma Virus) etiology and risk of spread /infectivity to other areas of body as well as to other people.  Multiple treatments and methods may be required to clear warts and it is possible treatment may not be successful.  Treatment risks include discoloration; scarring and there is still potential for wart recurrence.  VIRAL WARTS, UNSPECIFIED TYPE (2) Right Hallux Medial Paronychium of Toe, Right Hallux Proximal Dorsal Toe Destruction of lesion - Right Hallux Medial Paronychium of Toe, Right Hallux Proximal Dorsal Toe Complexity: simple   Destruction method: cryotherapy   Informed consent: discussed and consent obtained   Timeout:  patient name, date of birth, surgical site, and procedure verified Lesion destroyed using liquid nitrogen: Yes   Region frozen until ice ball extended beyond lesion: Yes   Outcome: patient tolerated procedure well with no complications   Post-procedure details: wound care  instructions given    Destruction of lesion - Right Hallux Medial Paronychium of Toe, Right Hallux Proximal Dorsal Toe Complexity: simple   Destruction method: chemical removal   Informed consent: discussed and consent obtained   Timeout:  patient name, date of birth, surgical site, and procedure verified Chemical destruction method: cantharidin   Procedure instructions: patient instructed to wash and dry area   Outcome: patient tolerated procedure well with no complications   Post-procedure details: sterile dressing applied and wound care instructions given   Dressing type: bandage    Return for 6-8weeks for wart f/u.   Documentation: I have reviewed the above documentation for accuracy and completeness, and I agree with the above.  I, Shirron Marcha Solders, CMA, am acting as scribe for [employer], DO.   Langston Reusing, DO

## 2023-05-13 ENCOUNTER — Ambulatory Visit: Admitting: Dermatology

## 2023-06-23 ENCOUNTER — Ambulatory Visit: Admitting: Dermatology

## 2023-07-21 ENCOUNTER — Ambulatory Visit: Attending: Physician Assistant | Admitting: Physical Therapy

## 2023-07-21 ENCOUNTER — Other Ambulatory Visit: Payer: Self-pay

## 2023-07-21 ENCOUNTER — Encounter: Payer: Self-pay | Admitting: Physical Therapy

## 2023-07-21 DIAGNOSIS — M542 Cervicalgia: Secondary | ICD-10-CM | POA: Diagnosis present

## 2023-07-21 DIAGNOSIS — M6281 Muscle weakness (generalized): Secondary | ICD-10-CM | POA: Insufficient documentation

## 2023-07-21 DIAGNOSIS — R293 Abnormal posture: Secondary | ICD-10-CM | POA: Diagnosis present

## 2023-07-21 NOTE — Therapy (Signed)
 OUTPATIENT PHYSICAL THERAPY CERVICAL EVALUATION   Patient Name: Craig Odonnell MRN: [id number] DOB:2000-10-15, 23 y.o., male Today's Date: 07/21/2023  END OF SESSION:  PT End of Session - 07/21/23 1522     Visit Number 1    Number of Visits 13    Date for PT Re-Evaluation 09/01/23    Authorization Type [employer]    Authorization Time Period 07/21/23 to 09/01/23    PT Start Time 1519    PT Stop Time 1553    PT Time Calculation (min) 34 min    Activity Tolerance Patient tolerated treatment well    Behavior During Therapy [facility] for tasks assessed/performed          History reviewed. No pertinent past medical history. Past Surgical History:  Procedure Laterality Date   TONSILLECTOMY     There are no active problems to display for this patient.   PCP: no PCP  REFERRING PROVIDER: Kline, Chianne, PA-C  REFERRING DIAG: M54.2 (ICD-10-CM) - Cervicalgia  THERAPY DIAG:  Cervicalgia  Abnormal posture  Muscle weakness (generalized)  Rationale for Evaluation and Treatment: Rehabilitation  ONSET DATE: late May 2025  SUBJECTIVE:                                                                                                                                                                                                         SUBJECTIVE STATEMENT:  Not sure what's going with the neck, shoulders were hurting too. Doctor recommended I come to PT. Its generally getting better. Could be because of my posture too. L shoulder and hand don't feel quite as strong as the right.   Hand dominance: Right  PERTINENT HISTORY:  See above   PAIN:  Are you having pain? Yes: NPRS scale: 4-5/10 now, in the past got up to 8-9/10 Pain location: neck  Pain description: aching  Aggravating factors: not sure Relieving factors: fixing posture a bit   PRECAUTIONS: None  RED FLAGS: None     WEIGHT BEARING RESTRICTIONS: No  FALLS:  Has patient fallen in last 6 months? 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    PATIENT SURVEYS:  NDI: 7/50   COGNITION: Overall cognitive status: Within  functional limits for tasks assessed  SENSATION: Not tested  POSTURE: rounded shoulders, forward head, and increased thoracic kyphosis  PALPATION:  Sore in B suboccipitals, thoracic paraspinals, B upper traps; noted B scapular winging at rest    CERVICAL ROM:   Active ROM A/PROM (deg) eval  Flexion WNL   Extension 25% limited   Right lateral flexion 25% limited   Left lateral flexion 25% limited   Right rotation WNL   Left rotation WNL    (Blank rows = not tested)  Thoracic AROM: flexion WNL, extension WNL, lateral flexion WNL B, rotation B WNL   UPPER EXTREMITY ROM:  Active ROM Right eval Left eval  Shoulder flexion Full  Full  Shoulder extension    Shoulder abduction Full  Full   Shoulder adduction    Shoulder extension    Shoulder internal rotation WNL  WNL   Shoulder external rotation WNL  WNL   Elbow flexion    Elbow extension    Wrist flexion    Wrist extension    Wrist ulnar deviation    Wrist radial deviation    Wrist pronation    Wrist supination     (Blank rows = not tested)  UPPER EXTREMITY MMT:  MMT Right eval Left eval  Shoulder flexion 4+ 4+  Shoulder extension    Shoulder abduction 4+ 4+  Shoulder adduction    Shoulder extension 4- 4-  Shoulder internal rotation    Shoulder external rotation    Middle trapezius 3+ 3+  Lower trapezius    Elbow flexion    Elbow extension    Wrist flexion    Wrist extension    Wrist ulnar deviation    Wrist radial  deviation    Wrist pronation    Wrist supination    Grip strength     (Blank rows = not tested)     TREATMENT DATE:   07/21/23  Eval, POC, HEP   UBE L3.5 x6 minutes backwards only                                                                                                                                   PATIENT EDUCATION:  Education details: exam findings, POC, HEP  Person educated: Patient Education method: Programmer, multimedia, Facilities manager, and Handouts Education comprehension: verbalized understanding, returned demonstration, and needs further education  HOME EXERCISE PROGRAM: Access Code: [email or web address] URL: [email or web address] Date: 07/21/2023 Prepared by: Josette Rough  Exercises - Seated Cervical Retraction  - 1 x daily - 7 x weekly - 2 sets - 10 reps - 3 seconds  hold - Seated Upper Trapezius Stretch  - 1 x daily - 7 x weekly - 2 sets - 6 reps - 30 seconds  hold - Scapular Retraction with Resistance  - 1 x daily - 7 x weekly - 2 sets - 10 reps - 1 second  hold - Shoulder extension with resistance - Neutral  -  1 x daily - 7 x weekly - 2 sets - 10 reps - 1 second  hold  ASSESSMENT:  CLINICAL IMPRESSION: Patient is a 23 y.o. M who was seen today for physical therapy evaluation and treatment for M54.2 (ICD-10-CM) - Cervicalgia. Does demonstrate muscle strength and flexibility imbalance which is likely leading to neck pain. He has been working on fixing his posture on his own,and pain has started to improve some. Endorses L UE feeling weaker than the right since pain started, no signs of radiculopathy noted. Anticipate he will respond well to PT.    OBJECTIVE IMPAIRMENTS: decreased ROM, decreased strength, increased muscle spasms, impaired UE functional use, improper body mechanics, postural dysfunction, and pain.   ACTIVITY LIMITATIONS: carrying, lifting, and reach over head  PARTICIPATION LIMITATIONS: meal prep, cleaning, laundry, driving, shopping,  community activity, occupation, and yard work  PERSONAL FACTORS: Age, Behavior pattern, Education, Past/current experiences, Social background, and Time since onset of injury/illness/exacerbation are also affecting patient's functional outcome.   REHAB POTENTIAL: Excellent  CLINICAL DECISION MAKING: Stable/uncomplicated  EVALUATION COMPLEXITY: Low   GOALS: Goals reviewed with patient? No  SHORT TERM GOALS: Target date: 08/04/2023    Will be compliant with appropriate progressive HEP  Baseline:  Goal status: INITIAL  2.  Will demonstrate improved functional posture with all tasks  Baseline:  Goal status: INITIAL   LONG TERM GOALS: Target date: 09/01/2023    Cervical ROM to have normalized  Baseline:  Goal status: INITIAL  2.  MMT to be 5/5 in all tested groups bilaterally  Baseline:  Goal status: INITIAL  3.  Cervical pain to have resolved  Baseline:  Goal status: INITIAL  4.  NDI to show 0% functionally impaired  Baseline:  Goal status: INITIAL    PLAN:  PT FREQUENCY: 2x/week  PT DURATION: 6 weeks  PLANNED INTERVENTIONS: 97750- Physical Performance Testing, 97110-Therapeutic exercises, 97530- Therapeutic activity, [id number]- Neuromuscular re-education, 97535- Self Care, and [id number]- Manual therapy  PLAN FOR NEXT SESSION: postural retraining and strengthening, PRE as tolerated   Josette Rough, PT, DPT 07/21/23 3:57 PM

## 2023-08-10 ENCOUNTER — Ambulatory Visit: Attending: Physician Assistant | Admitting: Physical Therapy

## 2023-08-10 DIAGNOSIS — R293 Abnormal posture: Secondary | ICD-10-CM | POA: Insufficient documentation

## 2023-08-10 DIAGNOSIS — M6281 Muscle weakness (generalized): Secondary | ICD-10-CM | POA: Insufficient documentation

## 2023-08-10 DIAGNOSIS — M542 Cervicalgia: Secondary | ICD-10-CM | POA: Insufficient documentation

## 2023-08-12 ENCOUNTER — Encounter: Admitting: Physical Therapy

## 2023-08-16 ENCOUNTER — Encounter: Payer: Self-pay | Admitting: Physical Therapy

## 2023-08-16 ENCOUNTER — Ambulatory Visit: Admitting: Physical Therapy

## 2023-08-16 DIAGNOSIS — M6281 Muscle weakness (generalized): Secondary | ICD-10-CM | POA: Diagnosis present

## 2023-08-16 DIAGNOSIS — R293 Abnormal posture: Secondary | ICD-10-CM | POA: Diagnosis present

## 2023-08-16 DIAGNOSIS — M542 Cervicalgia: Secondary | ICD-10-CM | POA: Diagnosis present

## 2023-08-16 NOTE — Therapy (Signed)
 OUTPATIENT PHYSICAL THERAPY CERVICAL TREATMENT   Patient Name: Craig Odonnell MRN: [id number] DOB:[birth year], 23 y.o., male Today's Date: 08/16/2023  END OF SESSION:  PT End of Session - 08/16/23 1104     Visit Number 2    Date for PT Re-Evaluation 09/01/23    PT Start Time 1104    PT Stop Time 1145    PT Time Calculation (min) 41 min    Activity Tolerance Patient tolerated treatment well    Behavior During Therapy [facility] for tasks assessed/performed          History reviewed. No pertinent past medical history. Past Surgical History:  Procedure Laterality Date   TONSILLECTOMY     There are no active problems to display for this patient.   PCP: no PCP  REFERRING PROVIDER: Kline, Chianne, PA-C  REFERRING DIAG: M54.2 (ICD-10-CM) - Cervicalgia  THERAPY DIAG:  Abnormal posture  Muscle weakness (generalized)  Cervicalgia  Rationale for Evaluation and Treatment: Rehabilitation  ONSET DATE: late May 2025  SUBJECTIVE:                                                                                                                                                                                                         SUBJECTIVE STATEMENT:  Doing all right but needs to work on his posture. Lower back hurts all the time. Works out  at he gym 5 to 6 days a week  Hand dominance: Right  PERTINENT HISTORY:  See above   PAIN:  Are you having pain? Yes: NPRS scale: 6/10 Pain location: neck  Pain description: aching  Aggravating factors: not sure Relieving factors: fixing posture a bit   PRECAUTIONS: None  RED FLAGS: None     WEIGHT BEARING RESTRICTIONS: No  FALLS:  Has patient fallen in last 6 months? 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    PATIENT SURVEYS:  NDI: 7/50   COGNITION: Overall cognitive status: Within functional limits for tasks assessed  SENSATION: Not tested  POSTURE: rounded shoulders, forward head, and increased thoracic kyphosis  PALPATION:  Sore in B suboccipitals, thoracic paraspinals, B upper traps; noted B scapular winging at rest    CERVICAL ROM:   Active  ROM A/PROM (deg) eval  Flexion WNL   Extension 25% limited   Right lateral flexion 25% limited   Left lateral flexion 25% limited   Right rotation WNL   Left rotation WNL    (Blank rows = not tested)  Thoracic AROM: flexion WNL, extension WNL, lateral flexion WNL B, rotation B WNL   UPPER EXTREMITY ROM:  Active ROM Right eval Left eval  Shoulder flexion Full  Full  Shoulder extension    Shoulder abduction Full  Full   Shoulder adduction    Shoulder extension    Shoulder internal rotation WNL  WNL   Shoulder external rotation WNL  WNL   Elbow flexion    Elbow extension    Wrist flexion    Wrist extension    Wrist ulnar deviation    Wrist radial deviation    Wrist pronation    Wrist supination     (Blank rows = not tested)  UPPER EXTREMITY MMT:  MMT Right eval Left eval  Shoulder flexion 4+ 4+  Shoulder extension    Shoulder abduction 4+ 4+  Shoulder adduction    Shoulder extension 4- 4-  Shoulder internal rotation    Shoulder external rotation    Middle trapezius 3+ 3+  Lower trapezius    Elbow flexion    Elbow extension    Wrist flexion    Wrist extension    Wrist ulnar deviation    Wrist radial deviation    Wrist pronation    Wrist supination    Grip strength     (Blank rows = not tested)     TREATMENT DATE:  08/16/23 UBE L2 x 2 min each Bike L3 x 3 min Rows & Lats 35lb 2x10 Hips Ext 10lb 2x10  AR press 20lb x 10 each Sit to stand  yellow ball 2x10 Bridges x10 LE on pball bridges, K2C, Oblq  Passive stretching HS, piriformis, ITB    07/21/23  Eval, POC, HEP   UBE L3.5 x6 minutes backwards only                                                                                                                                   PATIENT EDUCATION:  Education details: exam findings, POC, HEP  Person educated: Patient Education method: Programmer, multimedia, Facilities manager, and Handouts Education comprehension: verbalized understanding, returned demonstration, and needs further education  HOME EXERCISE PROGRAM: Access Code: [email or web address] URL: [email or web address] Date: 07/21/2023 Prepared by: Josette Rough  Exercises - Seated Cervical Retraction  - 1 x daily - 7 x weekly - 2 sets - 10 reps - 3 seconds  hold - Seated Upper Trapezius Stretch  - 1 x daily - 7 x weekly - 2 sets - 6 reps - 30 seconds  hold - Scapular Retraction with Resistance  - 1 x daily - 7 x weekly - 2 sets - 10 reps - 1 second  hold - Shoulder extension with  resistance - Neutral  - 1 x daily - 7 x weekly - 2 sets - 10 reps - 1 second  hold  ASSESSMENT:  CLINICAL IMPRESSION: Patient is a 23 y.o. M who was seen today for physical therapy evaluation and treatment for M54.2 (ICD-10-CM) - Cervicalgia. All interventions completed well despite high pain ratig. Less pain reported as session progressed.  He reports being more aware of his posture, admitting his work ergonomics needed's to be improved. dicussed appropriate step ups at work while at computer.   OBJECTIVE IMPAIRMENTS: decreased ROM, decreased strength, increased muscle spasms, impaired UE functional use, improper body mechanics, postural dysfunction, and pain.   ACTIVITY LIMITATIONS: carrying, lifting, and reach over head  PARTICIPATION LIMITATIONS: meal prep, cleaning, laundry, driving, shopping, community activity, occupation, and yard work  PERSONAL FACTORS: Age, Behavior pattern,  Education, Past/current experiences, Social background, and Time since onset of injury/illness/exacerbation are also affecting patient's functional outcome.   REHAB POTENTIAL: Excellent  CLINICAL DECISION MAKING: Stable/uncomplicated  EVALUATION COMPLEXITY: Low   GOALS: Goals reviewed with patient? No  SHORT TERM GOALS: Target date: 08/04/2023    Will be compliant with appropriate progressive HEP  Baseline:  Goal status: Ongoing 08/16/23  2.  Will demonstrate improved functional posture with all tasks  Baseline:  Goal status: INITIAL   LONG TERM GOALS: Target date: 09/01/2023    Cervical ROM to have normalized  Baseline:  Goal status: INITIAL  2.  MMT to be 5/5 in all tested groups bilaterally  Baseline:  Goal status: INITIAL  3.  Cervical pain to have resolved  Baseline:  Goal status: ongoing 08/16/23  4.  NDI to show 0% functionally impaired  Baseline:  Goal status: INITIAL    PLAN:  PT FREQUENCY: 2x/week  PT DURATION: 6 weeks  PLANNED INTERVENTIONS: 97750- Physical Performance Testing, 97110-Therapeutic exercises, 97530- Therapeutic activity, [id number]- Neuromuscular re-education, 97535- Self Care, and [id number]- Manual therapy  PLAN FOR NEXT SESSION: postural retraining and strengthening, PRE as tolerated   Josette Rough, PT, DPT 08/16/23 11:04 AM

## 2023-08-17 ENCOUNTER — Encounter: Admitting: Physical Therapy

## 2023-08-18 NOTE — Therapy (Signed)
 OUTPATIENT PHYSICAL THERAPY CERVICAL TREATMENT   Patient Name: Craig Odonnell MRN: [id number] DOB:Sep 20, 2000, 23 y.o., male Today's Date: 08/19/2023  END OF SESSION:  PT End of Session - 08/19/23 1150     Visit Number 3    Date for PT Re-Evaluation 09/01/23    PT Start Time 1150    PT Stop Time 1225    PT Time Calculation (min) 35 min    Activity Tolerance Patient tolerated treatment well    Behavior During Therapy [facility] for tasks assessed/performed           History reviewed. No pertinent past medical history. Past Surgical History:  Procedure Laterality Date   TONSILLECTOMY     There are no active problems to display for this patient.   PCP: no PCP  REFERRING PROVIDER: Kline, Chianne, PA-C  REFERRING DIAG: M54.2 (ICD-10-CM) - Cervicalgia  THERAPY DIAG:  Abnormal posture  Muscle weakness (generalized)  Cervicalgia  Rationale for Evaluation and Treatment: Rehabilitation  ONSET DATE: late May 2025  SUBJECTIVE:                                                                                                                                                                                                         SUBJECTIVE STATEMENT:  I am all good.   Hand dominance: Right  PERTINENT HISTORY:  See above   PAIN:  Are you having pain? Yes: NPRS scale: 6/10 Pain location: neck  Pain description: aching  Aggravating factors: not sure Relieving factors: fixing posture a bit   PRECAUTIONS: None  RED FLAGS: None     WEIGHT BEARING RESTRICTIONS: No  FALLS:  Has patient fallen in last 6 months? 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    PATIENT SURVEYS:  NDI: 7/50   COGNITION: Overall cognitive status: Within functional limits for tasks assessed  SENSATION: Not tested  POSTURE: rounded shoulders, forward head, and increased thoracic kyphosis  PALPATION:  Sore in B suboccipitals, thoracic paraspinals, B upper traps; noted B scapular winging at rest    CERVICAL ROM:   Active ROM A/PROM (deg) eval  Flexion WNL   Extension 25% limited   Right lateral flexion 25% limited   Left  lateral flexion 25% limited   Right rotation WNL   Left rotation WNL    (Blank rows = not tested)  Thoracic AROM: flexion WNL, extension WNL, lateral flexion WNL B, rotation B WNL   UPPER EXTREMITY ROM:  Active ROM Right eval Left eval  Shoulder flexion Full  Full  Shoulder extension    Shoulder abduction Full  Full   Shoulder adduction    Shoulder extension    Shoulder internal rotation WNL  WNL   Shoulder external rotation WNL  WNL   Elbow flexion    Elbow extension    Wrist flexion    Wrist extension    Wrist ulnar deviation    Wrist radial deviation    Wrist pronation    Wrist supination     (Blank rows = not tested)  UPPER EXTREMITY MMT:  MMT Right eval Left eval  Shoulder flexion 4+ 4+  Shoulder extension    Shoulder abduction 4+ 4+  Shoulder adduction    Shoulder extension 4- 4-  Shoulder internal rotation    Shoulder external rotation    Middle trapezius 3+ 3+  Lower trapezius    Elbow flexion    Elbow extension    Wrist flexion    Wrist extension    Wrist ulnar deviation    Wrist radial deviation    Wrist pronation    Wrist supination    Grip strength     (Blank rows = not tested)     TREATMENT DATE:  08/19/23 Shoulder extension 10# 2x12 Rows and lats 35# 2x12 BlackTB extension 2x10 UBE L3 x58mins  STS with OHP blue ball 2x10 4# shoulder shrugs 2x10 4# UT stretch Horizontal abd green 2x10  08/16/23 UBE L2 x 2 min each Bike  L3 x 3 min Rows & Lats 35lb 2x10 Hips Ext 10lb 2x10  AR press 20lb x 10 each Sit to stand yellow ball 2x10 Bridges x10 LE on pball bridges, K2C, Oblq  Passive stretching HS, piriformis, ITB    07/21/23  Eval, POC, HEP   UBE L3.5 x6 minutes backwards only                                                                                                                                   PATIENT EDUCATION:  Education details: exam findings, POC, HEP  Person educated: Patient Education method: Explanation, Demonstration, and Handouts Education comprehension: verbalized understanding, returned demonstration, and needs further education  HOME EXERCISE PROGRAM: Access Code: [email or web address] URL: [email or web address] Date: 07/21/2023 Prepared by: Josette Rough  Exercises - Seated Cervical Retraction  - 1 x daily - 7 x weekly - 2 sets - 10 reps - 3 seconds  hold - Seated Upper Trapezius Stretch  - 1 x daily - 7 x weekly - 2 sets - 6 reps - 30 seconds  hold - Scapular Retraction with Resistance  - 1 x daily - 7 x weekly - 2  sets - 10 reps - 1 second  hold - Shoulder extension with resistance - Neutral  - 1 x daily - 7 x weekly - 2 sets - 10 reps - 1 second  hold  ASSESSMENT:  CLINICAL IMPRESSION: Patient returns with no pain and reports he feels better overall. He states he was having pain but by the time he got the referral to come to PT he was already starting to feel better. Today we continued with some strengthening and talked about ergonomics at work and at the gym to remain pain free.  OBJECTIVE IMPAIRMENTS: decreased ROM, decreased strength, increased muscle spasms, impaired UE functional use, improper body mechanics, postural dysfunction, and pain.   ACTIVITY LIMITATIONS: carrying, lifting, and reach over head  PARTICIPATION LIMITATIONS: meal prep, cleaning, laundry, driving, shopping, community activity, occupation, and yard work  PERSONAL FACTORS: Age, Behavior  pattern, Education, Past/current experiences, Social background, and Time since onset of injury/illness/exacerbation are also affecting patient's functional outcome.   REHAB POTENTIAL: Excellent  CLINICAL DECISION MAKING: Stable/uncomplicated  EVALUATION COMPLEXITY: Low   GOALS: Goals reviewed with patient? No  SHORT TERM GOALS: Target date: 08/04/2023    Will be compliant with appropriate progressive HEP  Baseline:  Goal status: Ongoing 08/16/23, MET 08/19/23  2.  Will demonstrate improved functional posture with all tasks  Baseline:  Goal status: MET 08/19/23   LONG TERM GOALS: Target date: 09/01/2023    Cervical ROM to have normalized  Baseline:  Goal status: MET 08/19/23  2.  MMT to be 5/5 in all tested groups bilaterally  Baseline:  Goal status: 5/5 MET 08/19/23  3.  Cervical pain to have resolved  Baseline:  Goal status: ongoing 08/16/23, MET 08/19/23  4.  NDI to show 0% functionally impaired  Baseline:  Goal status: MET 08/19/23    PLAN:  PT FREQUENCY: 2x/week  PT DURATION: 6 weeks  PLANNED INTERVENTIONS: 97750- Physical Performance Testing, 97110-Therapeutic exercises, 97530- Therapeutic activity, 97112- Neuromuscular re-education, 97535- Self Care, and 02859- Manual therapy  PLAN FOR NEXT SESSION: d/c  PHYSICAL THERAPY DISCHARGE SUMMARY  Visits from Start of Care: 3   Patient agrees to discharge. Patient goals were met. Patient is being discharged due to being pleased with the current functional level.  Almetta Fam, [place], DPT 08/19/23 12:24 PM

## 2023-08-19 ENCOUNTER — Ambulatory Visit

## 2023-08-19 ENCOUNTER — Encounter: Admitting: Physical Therapy

## 2023-08-19 DIAGNOSIS — R293 Abnormal posture: Secondary | ICD-10-CM | POA: Diagnosis not present

## 2023-08-19 DIAGNOSIS — M542 Cervicalgia: Secondary | ICD-10-CM

## 2023-08-19 DIAGNOSIS — M6281 Muscle weakness (generalized): Secondary | ICD-10-CM

## 2023-08-24 ENCOUNTER — Encounter: Admitting: Physical Therapy

## 2023-08-26 ENCOUNTER — Ambulatory Visit: Admitting: Physical Therapy

## 2023-08-31 ENCOUNTER — Ambulatory Visit: Admitting: Physical Therapy

## 2023-09-01 ENCOUNTER — Ambulatory Visit: Admitting: Dermatology

## 2023-09-02 ENCOUNTER — Encounter: Admitting: Physical Therapy

## 2023-09-07 ENCOUNTER — Ambulatory Visit: Admitting: Physical Therapy

## 2023-09-09 ENCOUNTER — Ambulatory Visit: Admitting: Physical Therapy

## 2023-09-20 ENCOUNTER — Ambulatory Visit (INDEPENDENT_AMBULATORY_CARE_PROVIDER_SITE_OTHER)

## 2023-09-20 ENCOUNTER — Ambulatory Visit (HOSPITAL_BASED_OUTPATIENT_CLINIC_OR_DEPARTMENT_OTHER): Admitting: Student

## 2023-09-20 ENCOUNTER — Ambulatory Visit
Admission: EM | Admit: 2023-09-20 | Discharge: 2023-09-20 | Disposition: A | Discharge status: Home / Self Care | Attending: Family Medicine | Admitting: Family Medicine

## 2023-09-20 ENCOUNTER — Other Ambulatory Visit: Payer: Self-pay

## 2023-09-20 ENCOUNTER — Ambulatory Visit: Payer: Self-pay | Admitting: Nurse Practitioner

## 2023-09-20 DIAGNOSIS — S62025A Nondisplaced fracture of middle third of navicular [scaphoid] bone of left wrist, initial encounter for closed fracture: Secondary | ICD-10-CM

## 2023-09-20 HISTORY — DX: Gastro-esophageal reflux disease without esophagitis: K21.9

## 2023-09-20 NOTE — Discharge Instructions (Addendum)
 Please keep your wrist in the wrist brace until you are seen by orthopedics.  Please contact orthopedics at the information listed below to make a follow-up appointment in the next couple of days.  You may elevate and ice your wrist as needed.  You may continue over-the-counter ibuprofen  or Tylenol  as needed.  Please go to the ER if you develop any worsening symptoms prior to seeing orthopedics.  This includes but is not limited to uncontrolled pain or swelling, persistent numbness or tingling, or any new concerns that arise.  Hope you feel better soon!

## 2023-09-20 NOTE — ED Triage Notes (Signed)
 Pt states he was playing soccer and he fell on left wrist yesterday. Pt has 1+ swelling of left posterior wrist. Pt has 2+ left radial pulse, cap refill less than 3 sec, warm to touch

## 2023-09-20 NOTE — Progress Notes (Signed)
 Chief Complaint: Left wrist injury     History of Present Illness:    Craig Odonnell is a 23 y.o. right-hand-dominant male who presents today for evaluation of a left wrist injury.  Patient sustained a fall yesterday while playing soccer and braced his fall on an outstretched left hand.  He developed immediate, severe pain and had difficulty sleeping last night.  He was evaluated in urgent care earlier this morning and was placed in a wrist splint after x-rays demonstrated a scaphoid fracture.  Has been taking Tylenol  as needed.  Denies any numbness or tingling.   Surgical History:   None  PMH/PSH/Family History/Social History/Meds/Allergies:    Past Medical History:  Diagnosis Date   GERD (gastroesophageal reflux disease)    Past Surgical History:  Procedure Laterality Date   TONSILLECTOMY     Social History   Socioeconomic History   Marital status: Single    Spouse name: Not on file   Number of children: Not on file   Years of education: Not on file   Highest education level: Not on file  Occupational History   Not on file  Tobacco Use   Smoking status: Never    Passive exposure: Never   Smokeless tobacco: Never  Vaping Use   Vaping status: Every Day   Substances: Nicotine, Flavoring  Substance and Sexual Activity   Alcohol use: Yes    Comment: occ   Drug use: No   Sexual activity: Not on file  Other Topics Concern   Not on file  Social History Narrative   Not on file   Social Drivers of Health   Financial Resource Strain: Low Risk  (09/08/2023)   Received from [facility]   Overall Financial Resource Strain (CARDIA)    How hard is it for you to pay for the very basics like food, housing, medical care, and heating?: Not hard at all  Food Insecurity: No Food Insecurity (09/08/2023)   Received from [facility]   Hunger Vital Sign    Within the past 12 months, you worried that your food would run out before you got the money  to buy more.: Never true    Within the past 12 months, the food you bought just didn't last and you didn't have money to get more.: Never true  Transportation Needs: No Transportation Needs (09/08/2023)   Received from [facility] - Transportation    In the past 12 months, has lack of transportation kept you from medical appointments or from getting medications?: No    In the past 12 months, has lack of transportation kept you from meetings, work, or from getting things needed for daily living?: No  Physical Activity: Sufficiently Active (09/08/2023)   Received from [facility]   Exercise Vital Sign    On average, how many days per week do you engage in moderate to strenuous exercise (like a brisk walk)?: 5 days    On average, how many minutes do you engage in exercise at this level?: 60 min  Stress: No Stress Concern Present (09/08/2023)   Received from [facility] - Occupational Stress Questionnaire    Do you feel stress - tense, restless, nervous, or anxious, or unable to sleep at night because your mind is troubled  all the time - these days?: Only a little  Social Connections: Socially Integrated (09/08/2023)   Received from [facility]   Social Network    How would you rate your social network (family, work, friends)?: Good participation with social networks   Family History  Problem Relation Age of Onset   Cancer Mother    No Known Allergies Current Outpatient Medications  Medication Sig Dispense Refill   imiquimod  (ALDARA ) 5 % cream Apply a thin layer to the area at bedtime. 12 each 2   ketoconazole  (NIZORAL ) 2 % shampoo Apply topically 2 (two) times daily.     omeprazole (PRILOSEC) 20 MG capsule Take 20 mg by mouth daily.     Safety Seal Miscellaneous MISC Apply 1 Application topically at bedtime. Medication Name: G pen 1 each 2   tretinoin  (RETIN-A ) 0.05 % cream Apply topically at bedtime. Applt 2 nights weekly for 1 week  then increase to 3 nights weekly 45 g 3   No current facility-administered medications for this visit.   DG Wrist Complete Left Result Date: 09/20/2023 CLINICAL DATA:  injury EXAM: LEFT WRIST - COMPLETE 3+ VIEW COMPARISON:  None Available. FINDINGS: Nondisplaced fracture through the middle third of the scaphoid bone. No dislocation. There is no evidence of arthropathy or other focal bone abnormality. Moderate soft tissue swelling about the dorsum of the wrist. IMPRESSION: Nondisplaced fracture through the middle third of the scaphoid bone. These results will be called to the ordering clinician or representative by the Radiologist Assistant and communication documented in the PACS or [employer]. Electronically Signed   By: Rogelia Myers M.D.   On: 09/20/2023 09:37    Review of Systems:   A ROS was performed including pertinent positives and negatives as documented in the HPI.  Physical Exam :   Constitutional: NAD and appears stated age Neurological: Alert and oriented Psych: Appropriate affect and cooperative There were no vitals taken for this visit.   Comprehensive Musculoskeletal Exam:    Exam of the left wrist demonstrates diffuse mild soft tissue edema.  No tenderness over the distal radius or ulna.  Positive for anatomical snuffbox tenderness.  Patient able to form a light fist without grip strength.  Radial pulse 2+.  Brisk capillary refill and intact sensation to all 5 fingertips.  Imaging:   Xray review from urgent care (left wrist 4 views): Nondisplaced fracture of the middle third scaphoid   I personally reviewed and interpreted the radiographs.   Assessment:   23 y.o. male who sustained a FOOSH injury to the left wrist yesterday while playing soccer.  Patient was seen in urgent care this morning and x-rays have demonstrated a nondisplaced middle third fracture of the scaphoid.  No other evidence of bony abnormality and no other areas of focal tenderness on exam.   Discussed this pathology in detail due to the concern of delayed healing/nonunion as well as treatment recommendations.  Will place in a thumb spica cast today for immobilization and cast care instructions were discussed.  Estimate 6 to 8 weeks in the cast.  Will plan to see him back in 2 weeks for follow-up and likely x-ray at that time in order to assure no changes in displacement.  Plan :    - Thumb spica cast applied today with cast care instructions given - Return to clinic in 2 weeks for repeat follow-up     I personally saw and evaluated the patient, and participated in the management and treatment plan.  Leonce  Emiliano, PA-C Orthopedics

## 2023-09-20 NOTE — ED Provider Notes (Signed)
 [facility]    CSN: [id number] Arrival date & time: 09/20/23  0843      History   Chief Complaint No chief complaint on file.   HPI Craig Odonnell is a 23 y.o. male presents for wrist pain.  Patient reports yesterday while playing soccer he fell onto an outstretched left wrist.  States he had immediate pain and swelling to the medial aspect of the wrist.  No bruising numbness or tingling.  No hand pain.  No history of injuries or surgeries to the wrist in the past.  He took some Tylenol  for his symptoms.  No other concerns at this time  HPI  Past Medical History:  Diagnosis Date   GERD (gastroesophageal reflux disease)     There are no active problems to display for this patient.   Past Surgical History:  Procedure Laterality Date   TONSILLECTOMY         Home Medications    Prior to Admission medications   Medication Sig Start Date End Date Taking? Authorizing Provider  omeprazole (PRILOSEC) 20 MG capsule Take 20 mg by mouth daily. 08/26/23  Yes [provider]  imiquimod  (ALDARA ) 5 % cream Apply a thin layer to the area at bedtime. 02/10/23   Alm Delon SAILOR, DO  ketoconazole  (NIZORAL ) 2 % shampoo Apply topically 2 (two) times daily. 06/15/22   [provider]  Safety Seal Miscellaneous MISC Apply 1 Application topically at bedtime. Medication Name: G pen 11/11/22   Alm Delon SAILOR, DO  tretinoin  (RETIN-A ) 0.05 % cream Apply topically at bedtime. Applt 2 nights weekly for 1 week then increase to 3 nights weekly 02/04/23 02/04/24  Alm Delon SAILOR, DO    Family History Family History  Problem Relation Age of Onset   Cancer Mother     Social History Social History   Tobacco Use   Smoking status: Never    Passive exposure: Never   Smokeless tobacco: Never  Vaping Use   Vaping status: Every Day   Substances: Nicotine, Flavoring  Substance Use Topics   Alcohol use: Yes    Comment: occ   Drug use: No     Allergies   Patient has  no known allergies.   Review of Systems Review of Systems  Musculoskeletal:        Left wrist pain     Physical Exam Triage Vital Signs ED Triage Vitals  Encounter Vitals Group     BP 09/20/23 0854 113/73     Girls Systolic BP Percentile --      Girls Diastolic BP Percentile --      Boys Systolic BP Percentile --      Boys Diastolic BP Percentile --      Pulse Rate 09/20/23 0854 (!) 54     Resp 09/20/23 0854 17     Temp 09/20/23 0854 98.3 F (36.8 C)     Temp Source 09/20/23 0854 Oral     SpO2 09/20/23 0854 96 %     Weight --      Height --      Head Circumference --      Peak Flow --      Pain Score 09/20/23 0852 9     Pain Loc --      Pain Education --      Exclude from Growth Chart --    No data found.  Updated Vital Signs BP 113/73   Pulse (!) 54   Temp 98.3 F (36.8 C) (  Oral)   Resp 17   SpO2 96%   Visual Acuity Right Eye Distance:   Left Eye Distance:   Bilateral Distance:    Right Eye Near:   Left Eye Near:    Bilateral Near:     Physical Exam Vitals and nursing note reviewed.  Constitutional:      General: He is not in acute distress.    Appearance: Normal appearance. He is not ill-appearing.  HENT:     Head: Normocephalic and atraumatic.  Eyes:     Pupils: Pupils are equal, round, and reactive to light.  Cardiovascular:     Rate and Rhythm: Normal rate.  Pulmonary:     Effort: Pulmonary effort is normal.  Musculoskeletal:     Left wrist: Swelling, tenderness, bony tenderness and snuff box tenderness present. No deformity, effusion, lacerations or crepitus. Decreased range of motion. Normal pulse.  Skin:    General: Skin is warm and dry.  Neurological:     General: No focal deficit present.     Mental Status: He is alert and oriented to person, place, and time.  Psychiatric:        Mood and Affect: Mood normal.        Behavior: Behavior normal.      UC Treatments / Results  Labs (all labs ordered are listed, but only abnormal  results are displayed) Labs Reviewed - No data to display  EKG   Radiology DG Wrist Complete Left Result Date: 09/20/2023 CLINICAL DATA:  injury EXAM: LEFT WRIST - COMPLETE 3+ VIEW COMPARISON:  None Available. FINDINGS: Nondisplaced fracture through the middle third of the scaphoid bone. No dislocation. There is no evidence of arthropathy or other focal bone abnormality. Moderate soft tissue swelling about the dorsum of the wrist. IMPRESSION: Nondisplaced fracture through the middle third of the scaphoid bone. These results will be called to the ordering clinician or representative by the Radiologist Assistant and communication documented in the PACS or [employer]. Electronically Signed   By: Rogelia Myers M.D.   On: 09/20/2023 09:37    Procedures Procedures (including critical care time)  Medications Ordered in UC Medications - No data to display  Initial Impression / Assessment and Plan / UC Course  I have reviewed the triage vital signs and the nursing notes.  Pertinent labs & imaging results that were available during my care of the patient were reviewed by me and considered in my medical decision making (see chart for details).     Reviewed exam and symptoms with patient.  X-ray positive for scaphoid fracture.  Discussed with patient.  Placed in Velcro thumb spica and will have patient contact orthopedics to make a follow-up appointment this week.  He declined Rx pain medicine will use OTC analgesics as needed.  Reviewed RICE therapy.  Strict ER precautions reviewed and patient verbalized understanding Final Clinical Impressions(s) / UC Diagnoses   Final diagnoses:  Closed nondisplaced fracture of middle third of scaphoid bone of left wrist, initial encounter     Discharge Instructions      Please keep your wrist in the wrist brace until you are seen by orthopedics.  Please contact orthopedics at the information listed below to make a follow-up appointment in the next  couple of days.  You may elevate and ice your wrist as needed.  You may continue over-the-counter ibuprofen  or Tylenol  as needed.  Please go to the ER if you develop any worsening symptoms prior to seeing orthopedics.  This includes  but is not limited to uncontrolled pain or swelling, persistent numbness or tingling, or any new concerns that arise.  Hope you feel better soon!    ED Prescriptions   None    PDMP not reviewed this encounter.   Loreda Myla SAUNDERS, NP 09/20/23 [phone]

## 2023-09-21 ENCOUNTER — Ambulatory Visit (HOSPITAL_BASED_OUTPATIENT_CLINIC_OR_DEPARTMENT_OTHER): Admitting: Student

## 2023-10-11 ENCOUNTER — Ambulatory Visit (INDEPENDENT_AMBULATORY_CARE_PROVIDER_SITE_OTHER)

## 2023-10-11 ENCOUNTER — Ambulatory Visit (INDEPENDENT_AMBULATORY_CARE_PROVIDER_SITE_OTHER): Admitting: Student

## 2023-10-11 DIAGNOSIS — M25532 Pain in left wrist: Secondary | ICD-10-CM | POA: Diagnosis not present

## 2023-10-11 DIAGNOSIS — S62025A Nondisplaced fracture of middle third of navicular [scaphoid] bone of left wrist, initial encounter for closed fracture: Secondary | ICD-10-CM | POA: Diagnosis not present

## 2023-10-11 NOTE — Progress Notes (Signed)
 Chief Complaint: Left wrist injury     History of Present Illness:   10/11/23: Patient presents today for follow-up of a left scaphoid fracture.  He has been immobilized in a thumb spica cast and is tolerating this well without any complications.  He is leaving on a trip to the [place] this coming Saturday for a week.   Craig Odonnell is a 23 y.o. right-hand-dominant male who presents today for evaluation of a left wrist injury.  Patient sustained a fall yesterday while playing soccer and braced his fall on an outstretched left hand.  He developed immediate, severe pain and had difficulty sleeping last night.  He was evaluated in urgent care earlier this morning and was placed in a wrist splint after x-rays demonstrated a scaphoid fracture.  Has been taking Tylenol  as needed.  Denies any numbness or tingling.   Surgical History:   None  PMH/PSH/Family History/Social History/Meds/Allergies:    Past Medical History:  Diagnosis Date   GERD (gastroesophageal reflux disease)    Past Surgical History:  Procedure Laterality Date   TONSILLECTOMY     Social History   Socioeconomic History   Marital status: Single    Spouse name: Not on file   Number of children: Not on file   Years of education: Not on file   Highest education level: Not on file  Occupational History   Not on file  Tobacco Use   Smoking status: Never    Passive exposure: Never   Smokeless tobacco: Never  Vaping Use   Vaping status: Every Day   Substances: Nicotine, Flavoring  Substance and Sexual Activity   Alcohol use: Yes    Comment: occ   Drug use: No   Sexual activity: Not on file  Other Topics Concern   Not on file  Social History Narrative   Not on file   Social Drivers of Health   Financial Resource Strain: Low Risk  (09/08/2023)   Received from [facility]   Overall Financial Resource Strain (CARDIA)    How hard is it for you to pay for the very basics like  food, housing, medical care, and heating?: Not hard at all  Food Insecurity: No Food Insecurity (09/08/2023)   Received from [facility]   Hunger Vital Sign    Within the past 12 months, you worried that your food would run out before you got the money to buy more.: Never true    Within the past 12 months, the food you bought just didn't last and you didn't have money to get more.: Never true  Transportation Needs: No Transportation Needs (09/08/2023)   Received from [facility] - Transportation    In the past 12 months, has lack of transportation kept you from medical appointments or from getting medications?: No    In the past 12 months, has lack of transportation kept you from meetings, work, or from getting things needed for daily living?: No  Physical Activity: Sufficiently Active (09/08/2023)   Received from [facility]   Exercise Vital Sign    On average, how many days per week do you engage in moderate to strenuous exercise (like a brisk walk)?: 5 days    On average, how many minutes do you engage in exercise at this level?: 60 min  Stress:  No Stress Concern Present (09/08/2023)   Received from [facility] - Occupational Stress Questionnaire    Do you feel stress - tense, restless, nervous, or anxious, or unable to sleep at night because your mind is troubled all the time - these days?: Only a little  Social Connections: Socially Integrated (09/08/2023)   Received from [facility]   Social Network    How would you rate your social network (family, work, friends)?: Good participation with social networks   Family History  Problem Relation Age of Onset   Cancer Mother    No Known Allergies Current Outpatient Medications  Medication Sig Dispense Refill   imiquimod  (ALDARA ) 5 % cream Apply a thin layer to the area at bedtime. 12 each 2   ketoconazole  (NIZORAL ) 2 % shampoo Apply topically 2 (two) times daily.     omeprazole  (PRILOSEC) 20 MG capsule Take 20 mg by mouth daily.     Safety Seal Miscellaneous MISC Apply 1 Application topically at bedtime. Medication Name: G pen 1 each 2   tretinoin  (RETIN-A ) 0.05 % cream Apply topically at bedtime. Applt 2 nights weekly for 1 week then increase to 3 nights weekly 45 g 3   No current facility-administered medications for this visit.   No results found.   Review of Systems:   A ROS was performed including pertinent positives and negatives as documented in the HPI.  Physical Exam :   Constitutional: NAD and appears stated age Neurological: Alert and oriented Psych: Appropriate affect and cooperative There were no vitals taken for this visit.   Comprehensive Musculoskeletal Exam:    Patient is immobilized in a left thumb spica cast which appears to be clean dry and intact.  Distal perfusion and sensation intact.  Imaging:   Xray (left wrist 3 views): Decreased visualization due to overlying cast material but fracture appears faintly visible with no increase in displacement   I personally reviewed and interpreted the radiographs.   Assessment:   23 y.o. male now 3 weeks status post FOOSH injury to left wrist resulting in a nondisplaced middle third scaphoid fracture.  He has been immobilized in a thumb spica cast and is tolerating this well.  X-rays today show no increase in fracture displacement.  Discussed that I would recommend 3 more weeks of cast immobilization in order to encourage continued healing.  Patient is leaving [place] this [place] would like to be able to transition out of the cast in order to be able to get into the water.  I did discuss that cast does provide better immobilization for scaphoid fractures and that coming out of the cast at this time could potentially result in a higher risk of complication or nonunion.  Patient states understanding of this but would like to return later this week for cast removal and  transition into a thumb spica brace.  Plan :    - Return on 10/10 for cast removal     I personally saw and evaluated the patient, and participated in the management and treatment plan.  Leonce Reveal, PA-C Orthopedics

## 2023-10-15 ENCOUNTER — Ambulatory Visit (HOSPITAL_BASED_OUTPATIENT_CLINIC_OR_DEPARTMENT_OTHER): Admitting: Student

## 2023-10-15 DIAGNOSIS — S62025A Nondisplaced fracture of middle third of navicular [scaphoid] bone of left wrist, initial encounter for closed fracture: Secondary | ICD-10-CM | POA: Diagnosis not present

## 2023-10-15 NOTE — Progress Notes (Signed)
 Chief Complaint: Left wrist injury     History of Present Illness:   10/11/23: Patient presents today for follow-up of a left scaphoid fracture.  He has been immobilized in a thumb spica cast and is tolerating this well without any complications.  He is leaving on a trip to the [place] this coming Saturday for a week.   Craig Odonnell is a 23 y.o. right-hand-dominant male who presents today for evaluation of a left wrist injury.  Patient sustained a fall yesterday while playing soccer and braced his fall on an outstretched left hand.  He developed immediate, severe pain and had difficulty sleeping last night.  He was evaluated in urgent care earlier this morning and was placed in a wrist splint after x-rays demonstrated a scaphoid fracture.  Has been taking Tylenol  as needed.  Denies any numbness or tingling.   Surgical History:   None  PMH/PSH/Family History/Social History/Meds/Allergies:    Past Medical History:  Diagnosis Date   GERD (gastroesophageal reflux disease)    Past Surgical History:  Procedure Laterality Date   TONSILLECTOMY     Social History   Socioeconomic History   Marital status: Single    Spouse name: Not on file   Number of children: Not on file   Years of education: Not on file   Highest education level: Not on file  Occupational History   Not on file  Tobacco Use   Smoking status: Never    Passive exposure: Never   Smokeless tobacco: Never  Vaping Use   Vaping status: Every Day   Substances: Nicotine, Flavoring  Substance and Sexual Activity   Alcohol use: Yes    Comment: occ   Drug use: No   Sexual activity: Not on file  Other Topics Concern   Not on file  Social History Narrative   Not on file   Social Drivers of Health   Financial Resource Strain: Low Risk  (09/08/2023)   Received from [facility]   Overall Financial Resource Strain (CARDIA)    How hard is it for you to pay for the very basics like  food, housing, medical care, and heating?: Not hard at all  Food Insecurity: No Food Insecurity (09/08/2023)   Received from [facility]   Hunger Vital Sign    Within the past 12 months, you worried that your food would run out before you got the money to buy more.: Never true    Within the past 12 months, the food you bought just didn't last and you didn't have money to get more.: Never true  Transportation Needs: No Transportation Needs (09/08/2023)   Received from [facility] - Transportation    In the past 12 months, has lack of transportation kept you from medical appointments or from getting medications?: No    In the past 12 months, has lack of transportation kept you from meetings, work, or from getting things needed for daily living?: No  Physical Activity: Sufficiently Active (09/08/2023)   Received from [facility]   Exercise Vital Sign    On average, how many days per week do you engage in moderate to strenuous exercise (like a brisk walk)?: 5 days    On average, how many minutes do you engage in exercise at this level?: 60 min  Stress:  No Stress Concern Present (09/08/2023)   Received from [facility] - Occupational Stress Questionnaire    Do you feel stress - tense, restless, nervous, or anxious, or unable to sleep at night because your mind is troubled all the time - these days?: Only a little  Social Connections: Socially Integrated (09/08/2023)   Received from [facility]   Social Network    How would you rate your social network (family, work, friends)?: Good participation with social networks   Family History  Problem Relation Age of Onset   Cancer Mother    No Known Allergies Current Outpatient Medications  Medication Sig Dispense Refill   imiquimod  (ALDARA ) 5 % cream Apply a thin layer to the area at bedtime. 12 each 2   ketoconazole  (NIZORAL ) 2 % shampoo Apply topically 2 (two) times daily.     omeprazole  (PRILOSEC) 20 MG capsule Take 20 mg by mouth daily.     Safety Seal Miscellaneous MISC Apply 1 Application topically at bedtime. Medication Name: G pen 1 each 2   tretinoin  (RETIN-A ) 0.05 % cream Apply topically at bedtime. Applt 2 nights weekly for 1 week then increase to 3 nights weekly 45 g 3   No current facility-administered medications for this visit.   No results found.   Review of Systems:   A ROS was performed including pertinent positives and negatives as documented in the HPI.  Physical Exam :   Constitutional: NAD and appears stated age Neurological: Alert and oriented Psych: Appropriate affect and cooperative There were no vitals taken for this visit.   Comprehensive Musculoskeletal Exam:    Thumb spica cast appears clean dry and intact. Patient tolerated cast removal and transition into Exos thumb spica well with good comfort. Motion and sensation intact to the fingers.  Imaging:     Assessment:   23 y.o. male approximately 4-week status post injury resulting in a nondisplaced middle third scaphoid fracture.  He was seen today for cast removal and transition into an Exos thumb spica brace given his trip tomorrow to the [place].  Brace care instructions were discussed and would like to have him wear this consistently with the exception of exposure to hot water.  Plan :    - Follow-up in 2 weeks for reassessment and repeat x-rays     I personally saw and evaluated the patient, and participated in the management and treatment plan.  Leonce Reveal, PA-C Orthopedics

## 2023-10-29 ENCOUNTER — Ambulatory Visit (HOSPITAL_BASED_OUTPATIENT_CLINIC_OR_DEPARTMENT_OTHER): Admitting: Student

## 2023-10-29 ENCOUNTER — Ambulatory Visit (INDEPENDENT_AMBULATORY_CARE_PROVIDER_SITE_OTHER)

## 2023-10-29 DIAGNOSIS — S62025A Nondisplaced fracture of middle third of navicular [scaphoid] bone of left wrist, initial encounter for closed fracture: Secondary | ICD-10-CM

## 2023-10-29 NOTE — Progress Notes (Signed)
 Chief Complaint: Left wrist injury     History of Present Illness:   10/29/23: Patient presents today for follow-up of a left scaphoid fracture which occurred due to a fall almost 6 weeks ago.  At last visit he was transferred from the thumb spica cast into an Exos brace given recent trip to the [place].  He reports tolerating the brace very well and no longer reports any discomfort within the wrist without use of pain medication.    Craig Odonnell is a 23 y.o. right-hand-dominant male who presents today for evaluation of a left wrist injury.  Patient sustained a fall yesterday while playing soccer and braced his fall on an outstretched left hand.  He developed immediate, severe pain and had difficulty sleeping last night.  He was evaluated in urgent care earlier this morning and was placed in a wrist splint after x-rays demonstrated a scaphoid fracture.  Has been taking Tylenol  as needed.  Denies any numbness or tingling.   Surgical History:   None  PMH/PSH/Family History/Social History/Meds/Allergies:    Past Medical History:  Diagnosis Date   GERD (gastroesophageal reflux disease)    Past Surgical History:  Procedure Laterality Date   TONSILLECTOMY     Social History   Socioeconomic History   Marital status: Single    Spouse name: Not on file   Number of children: Not on file   Years of education: Not on file   Highest education level: Not on file  Occupational History   Not on file  Tobacco Use   Smoking status: Never    Passive exposure: Never   Smokeless tobacco: Never  Vaping Use   Vaping status: Every Day   Substances: Nicotine, Flavoring  Substance and Sexual Activity   Alcohol use: Yes    Comment: occ   Drug use: No   Sexual activity: Not on file  Other Topics Concern   Not on file  Social History Narrative   Not on file   Social Drivers of Health   Financial Resource Strain: Low Risk  (09/08/2023)   Received from  [facility]   Overall Financial Resource Strain (CARDIA)    How hard is it for you to pay for the very basics like food, housing, medical care, and heating?: Not hard at all  Food Insecurity: No Food Insecurity (09/08/2023)   Received from [facility]   Hunger Vital Sign    Within the past 12 months, you worried that your food would run out before you got the money to buy more.: Never true    Within the past 12 months, the food you bought just didn't last and you didn't have money to get more.: Never true  Transportation Needs: No Transportation Needs (09/08/2023)   Received from [facility] - Transportation    In the past 12 months, has lack of transportation kept you from medical appointments or from getting medications?: No    In the past 12 months, has lack of transportation kept you from meetings, work, or from getting things needed for daily living?: No  Physical Activity: Sufficiently Active (09/08/2023)   Received from [facility]   Exercise Vital Sign    On average, how many days per week do you engage in moderate to strenuous exercise (like a brisk walk)?: 5  days    On average, how many minutes do you engage in exercise at this level?: 60 min  Stress: No Stress Concern Present (09/08/2023)   Received from [facility] of Occupational Health - Occupational Stress Questionnaire    Do you feel stress - tense, restless, nervous, or anxious, or unable to sleep at night because your mind is troubled all the time - these days?: Only a little  Social Connections: Socially Integrated (09/08/2023)   Received from [facility]   Social Network    How would you rate your social network (family, work, friends)?: Good participation with social networks   Family History  Problem Relation Age of Onset   Cancer Mother    No Known Allergies Current Outpatient Medications  Medication Sig Dispense Refill   imiquimod  (ALDARA ) 5 % cream Apply a thin layer to the  area at bedtime. 12 each 2   ketoconazole  (NIZORAL ) 2 % shampoo Apply topically 2 (two) times daily.     omeprazole (PRILOSEC) 20 MG capsule Take 20 mg by mouth daily.     Safety Seal Miscellaneous MISC Apply 1 Application topically at bedtime. Medication Name: G pen 1 each 2   tretinoin  (RETIN-A ) 0.05 % cream Apply topically at bedtime. Applt 2 nights weekly for 1 week then increase to 3 nights weekly 45 g 3   No current facility-administered medications for this visit.   No results found.   Review of Systems:   A ROS was performed including pertinent positives and negatives as documented in the HPI.  Physical Exam :   Constitutional: NAD and appears stated age Neurological: Alert and oriented Psych: Appropriate affect and cooperative There were no vitals taken for this visit.   Comprehensive Musculoskeletal Exam:    Exam of the left wrist demonstrates no significant swelling or obvious deformity.  There is no tenderness over the anatomical snuffbox.  Patient can form a composite fist with 5/5 strength.  Slight dorsal discomfort noted with active wrist extension.  Distal neurosensory exam intact.  Imaging:   Xray (left wrist 3 views): Healing middle third fracture of the scaphoid with significant internal callus formation   I personally reviewed and interpreted the radiographs.:  Assessment:   23 y.o. male now 6-week status post nondisplaced middle third scaphoid fracture of the left wrist.  He has been treated with conservative management with immobilization and has overall tolerated this well.  X-rays today show good progression of healing with very limited visualization of the fracture line.  He demonstrates good strength and range of motion on exam with only a little bit of discomfort and wrist extension which believe is more soft tissue in nature.  Discussed that I would recommend another week in the brace to ensure continued healing, but at that point he can begin weaning out  of the brace as tolerated and would advise another 2 weeks from today before working into any heavy lifting.  Should he continue to experience any pain or symptoms I would like to see him back for follow-up, otherwise can plan to return as needed.  Plan :    - Follow-up as needed     I personally saw and evaluated the patient, and participated in the management and treatment plan.  Leonce Reveal, PA-C Orthopedics

## 2023-11-08 ENCOUNTER — Encounter: Payer: Self-pay | Admitting: Radiology

## 2023-11-17 ENCOUNTER — Encounter: Payer: Self-pay | Admitting: Gastroenterology

## 2023-11-17 ENCOUNTER — Ambulatory Visit: Admitting: Gastroenterology

## 2023-11-17 VITALS — BP 102/62 | HR 70 | Ht 67.0 in | Wt 133.0 lb

## 2023-11-17 DIAGNOSIS — R1013 Epigastric pain: Secondary | ICD-10-CM | POA: Diagnosis not present

## 2023-11-17 DIAGNOSIS — K219 Gastro-esophageal reflux disease without esophagitis: Secondary | ICD-10-CM | POA: Diagnosis not present

## 2023-11-17 NOTE — Progress Notes (Signed)
 Chief Complaint:dyspepsia  Primary GI Doctor: Dr. Charlanne   HPI:  Patient is a  23  year old male patient with no significant medical history who was referred to me by Kline, Chianne, PA-C on 09/22/23 for a evaluation of dyspepsia.    Interval History    Patient reports three months he started to have intermittent burning in his throat and stomach. He reports it was worse with eating and first thing waking up. No known food triggers. He has tried to avoid acidic foods and greasy fatty foods. He took omeprazole 20 mg for one month and notes it did help but did not completely resolved the symptoms. Symptoms improved with exercise. Not  taking any NSAID's.  Notes history of globus sensation that has since then resolved. No nausea. No dysphagia. No diarrhea or constipation. No black tarry stools.   He notes having recent panic attack with no known triggers, but reports no longer issue.  He vapes daily. Drinks socially. No current marijuana use.  Surgical history: tonsillectomy   Patient's family history includes mother with breast CA.   Patient will be traveling for the month of December to [place].  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   Wt Readings from Last 3 Encounters:  11/17/23 133 lb (60.3 kg)  04/19/15 104 lb 11.5 oz (47.5 kg) (3%, Z= -1.92)*  05/15/13 91 lb (41.3 kg) (5%, Z= -1.63)*   * Growth percentiles are based on CDC (Boys, 2-20 Years) data.     Past Medical History:  Diagnosis Date   GERD (gastroesophageal reflux disease)     Past Surgical History:  Procedure Laterality Date   TONSILLECTOMY      Current Outpatient Medications  Medication Sig Dispense Refill   omeprazole (PRILOSEC) 20 MG capsule Take 20 mg by mouth daily.     imiquimod  (ALDARA ) 5 % cream Apply a thin layer to the area at bedtime. 12 each 2   ketoconazole  (NIZORAL ) 2 % shampoo Apply topically 2 (two) times daily.     Safety Seal Miscellaneous MISC Apply 1 Application topically at bedtime.  Medication Name: G pen 1 each 2   tretinoin  (RETIN-A ) 0.05 % cream Apply topically at bedtime. Applt 2 nights weekly for 1 week then increase to 3 nights weekly 45 g 3   No current facility-administered medications for this visit.    Allergies as of 11/17/2023   (No Known Allergies)    Family History  Problem Relation Age of Onset   Cancer Mother     Review of Systems:    Constitutional: No weight loss, fever, chills, weakness or fatigue HEENT: Eyes: No change in vision               Ears, Nose, Throat:  No change in hearing or congestion Skin: No rash or itching Cardiovascular: No chest pain, chest pressure or palpitations   Respiratory: No SOB or cough Gastrointestinal: See HPI and otherwise negative Genitourinary: No dysuria or change in urinary frequency Neurological: No headache, dizziness or syncope Musculoskeletal: No new muscle or joint pain Hematologic: No bleeding or bruising Psychiatric: No history of depression or anxiety    Physical Exam:  Vital signs: BP 102/62   Pulse 70   Ht 5' 7 (1.702 m)   Wt 133 lb (60.3 kg)   BMI 20.83 kg/m   Constitutional:   Pleasant male appears to be in NAD, Well developed, Well nourished, alert and cooperative Throat: Oral cavity and pharynx without inflammation, swelling  or lesion.  Respiratory: Respirations even and unlabored. Lungs clear to auscultation bilaterally.   No wheezes, crackles, or rhonchi.  Cardiovascular: Normal S1, S2. Regular rate and rhythm. No peripheral edema, cyanosis or pallor.  Gastrointestinal:  Soft, nondistended, nontender. No rebound or guarding. Normal bowel sounds. No appreciable masses or hepatomegaly. Rectal:  Not performed.  Msk:  Symmetrical without gross deformities. Without edema, no deformity or joint abnormality.  Neurologic:  Alert and  oriented x4;  grossly normal neurologically.  Skin:   Dry and intact without significant lesions or rashes.  RELEVANT LABS AND IMAGING: CBC    Latest  Ref Rng & Units 10/05/2016    1:24 PM 05/11/2010   10:12 PM  CBC  WBC 4.0 - 10.5 K/uL 11.5  12.8   Hemoglobin 13.0 - 17.0 g/dL 85.1  87.9   Hematocrit 39.0 - 52.0 % 44.1  33.4   Platelets 150 - 400 K/uL 225  330      CMP     Latest Ref Rng & Units 10/05/2016    1:24 PM 05/12/2010    6:19 AM 05/11/2010   10:12 PM  CMP  Glucose 65 - 99 mg/dL 90  883  64   BUN 6 - 20 mg/dL 15  9  13    Creatinine 0.61 - 1.24 mg/dL 9.15  <9.52  <9.52   Sodium 135 - 145 mmol/L 138  134  136   Potassium 3.5 - 5.1 mmol/L 4.0  3.7  4.2   Chloride 101 - 111 mmol/L 105  99  95   CO2 22 - 32 mmol/L 25  25  23    Calcium 8.9 - 10.3 mg/dL 9.7  9.2  89.9   Total Protein 6.5 - 8.1 g/dL 7.8  6.7    Total Bilirubin 0.3 - 1.2 mg/dL 1.6  0.2    Alkaline Phos 38 - 126 U/L 70  115    AST 15 - 41 U/L 24  18    ALT 17 - 63 U/L 13  7    09/08/23 labs show: wbc 7, hgb 15.1, plt 242, BUN 12, Creat 1.09, normal LFTs, H pylroi breath test negative,  03/2023 labs show: TSH 0.967  Assessment: Encounter Diagnoses  Name Primary?   Abdominal pain, epigastric Yes   Gastroesophageal reflux disease, unspecified whether esophagitis present      23 year old male patient who presents with intermittent dyspepsia not completely relieved with PPI therapy and strict GERD diet.  Negative H. pylori breath test.  Patient would like to proceed with upper GI endoscopy to evaluate and rule out GERD, gastritis, or peptic ulcer disease.  Recommended the patient go back on PPI therapy however he would like to hold off for now. Avoid NSAIDs. Avoid Alcohol. Consider imaging if unremarkable and symptoms continue.  Plan: -Recommend GERD diet, no late meals  -offered restarting Omeprazole , would like to hold off -Schedule EGD in [facility] with Dr.Gupta. The risks and benefits of EGD with possible biopsies and esophageal dilation were discussed with the patient who agrees to proceed.   Thank you for the courtesy of this consult. Please call me with any questions  or concerns.   Sencere Symonette, FNP-C [facility] 11/17/2023, 12:16 PM  Cc: Kline, Chianne, PA-C

## 2023-11-17 NOTE — Patient Instructions (Addendum)
 Recommend GERD diet, no late meals No NSAIDs  You have been scheduled for an endoscopy. Please follow written instructions given to you at your visit today.  If you use inhalers (even only as needed), please bring them with you on the day of your procedure.  If you take any of the following medications, they will need to be adjusted prior to your procedure:   DO NOT TAKE 7 DAYS PRIOR TO TEST- Trulicity (dulaglutide) Ozempic, Wegovy (semaglutide) Mounjaro, Zepbound (tirzepatide) Bydureon Bcise (exanatide extended release)  DO NOT TAKE 1 DAY PRIOR TO YOUR TEST Rybelsus (semaglutide) Adlyxin (lixisenatide) Victoza (liraglutide) Byetta (exanatide) ___________________________________________________________________________  Due to recent changes in healthcare laws, you may see the results of your imaging and laboratory studies on MyChart before your provider has had a chance to review them.  We understand that in some cases there may be results that are confusing or concerning to you. Not all laboratory results come back in the same time frame and the provider may be waiting for multiple results in order to interpret others.  Please give us  48 hours in order for your provider to thoroughly review all the results before contacting the office for clarification of your results.    _______________________________________________________  If your blood pressure at your visit was 140/90 or greater, please contact your primary care physician to follow up on this.  _______________________________________________________  If you are age 25 or older, your body mass index should be between 23-30. Your Body mass index is 20.83 kg/m. If this is out of the aforementioned range listed, please consider follow up with your Primary Care Provider.  If you are age 25 or younger, your body mass index should be between 19-25. Your Body mass index is 20.83 kg/m. If this is out of the aformentioned range  listed, please consider follow up with your Primary Care Provider.   ________________________________________________________  The [facility] providers would like to encourage you to use MYCHART to communicate with providers for non-urgent requests or questions.  Due to long hold times on the telephone, sending your provider a message by [facility] may be a faster and more efficient way to get a response.  Please allow 48 business hours for a response.  Please remember that this is for non-urgent requests.  _______________________________________________________  [facility] is using a team-based approach to care.  Your team is made up of your doctor and two to three APPS. Our APPS (Nurse Practitioners and Physician Assistants) work with your physician to ensure care continuity for you. They are fully qualified to address your health concerns and develop a treatment plan. They communicate directly with your gastroenterologist to care for you. Seeing the Advanced Practice Practitioners on your physician's team can help you by facilitating care more promptly, often allowing for earlier appointments, access to diagnostic testing, procedures, and other specialty referrals.   Thank you for trusting me with your gastrointestinal care. Deanna May, FNP-C

## 2023-11-18 ENCOUNTER — Encounter: Payer: Self-pay | Admitting: Gastroenterology

## 2023-11-18 ENCOUNTER — Ambulatory Visit (AMBULATORY_SURGERY_CENTER): Admitting: Gastroenterology

## 2023-11-18 VITALS — BP 98/68 | HR 74 | Temp 97.1°F | Resp 18 | Ht 67.0 in | Wt 133.0 lb

## 2023-11-18 DIAGNOSIS — K219 Gastro-esophageal reflux disease without esophagitis: Secondary | ICD-10-CM | POA: Diagnosis not present

## 2023-11-18 DIAGNOSIS — K2970 Gastritis, unspecified, without bleeding: Secondary | ICD-10-CM

## 2023-11-18 DIAGNOSIS — R1013 Epigastric pain: Secondary | ICD-10-CM

## 2023-11-18 MED ORDER — SODIUM CHLORIDE 0.9 % IV SOLN: 500.0000 mL | INTRAVENOUS | Status: AC

## 2023-11-18 NOTE — Progress Notes (Signed)
 Pt's states no medical or surgical changes since previsit or office visit.

## 2023-11-18 NOTE — Progress Notes (Signed)
 Chief Complaint:dyspepsia  Primary GI Doctor: Dr. Charlanne    HPI:  Patient is a  23  year old male patient with no significant medical history who was referred to me by Kline, Chianne, PA-C on 09/22/23 for a evaluation of dyspepsia.     Interval History    Patient reports three months he started to have intermittent burning in his throat and stomach. He reports it was worse with eating and first thing waking up. No known food triggers. He has tried to avoid acidic foods and greasy fatty foods. He took omeprazole 20 mg for one month and notes it did help but did not completely resolved the symptoms. Symptoms improved with exercise. Not  taking any NSAID's.  Notes history of globus sensation that has since then resolved. No nausea. No dysphagia. No diarrhea or constipation. No black tarry stools.    He notes having recent panic attack with no known triggers, but reports no longer issue.   He vapes daily. Drinks socially. No current marijuana use.   Surgical history: tonsillectomy    Patient's family history includes mother with breast CA.    Patient will be traveling for the month of December to [place].   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       Wt Readings from Last 3 Encounters:  11/17/23 133 lb (60.3 kg)  04/19/15 104 lb 11.5 oz (47.5 kg) (3%, Z= -1.92)*  05/15/13 91 lb (41.3 kg) (5%, Z= -1.63)*    * Growth percentiles are based on CDC (Boys, 2-20 Years) data.          Past Medical History:  Diagnosis Date   GERD (gastroesophageal reflux disease)                 Past Surgical History:  Procedure Laterality Date   TONSILLECTOMY                    Current Outpatient Medications  Medication Sig Dispense Refill   omeprazole (PRILOSEC) 20 MG capsule Take 20 mg by mouth daily.       imiquimod  (ALDARA ) 5 % cream Apply a thin layer to the area at bedtime. 12 each 2   ketoconazole  (NIZORAL ) 2 % shampoo Apply topically 2 (two) times daily.       Safety Seal  Miscellaneous MISC Apply 1 Application topically at bedtime. Medication Name: G pen 1 each 2   tretinoin  (RETIN-A ) 0.05 % cream Apply topically at bedtime. Applt 2 nights weekly for 1 week then increase to 3 nights weekly 45 g 3      No current facility-administered medications for this visit.           Allergies as of 11/17/2023   (No Known Allergies)           Family History  Problem Relation Age of Onset   Cancer Mother            Review of Systems:    Constitutional: No weight loss, fever, chills, weakness or fatigue HEENT: Eyes: No change in vision               Ears, Nose, Throat:  No change in hearing or congestion Skin: No rash or itching Cardiovascular: No chest pain, chest pressure or palpitations   Respiratory: No SOB or cough Gastrointestinal: See HPI and otherwise negative Genitourinary: No dysuria or change in urinary frequency Neurological: No headache, dizziness or syncope Musculoskeletal: No new muscle or joint  pain Hematologic: No bleeding or bruising Psychiatric: No history of depression or anxiety      Physical Exam:  Vital signs: BP 102/62   Pulse 70   Ht 5' 7 (1.702 m)   Wt 133 lb (60.3 kg)   BMI 20.83 kg/m    Constitutional:   Pleasant male appears to be in NAD, Well developed, Well nourished, alert and cooperative Throat: Oral cavity and pharynx without inflammation, swelling or lesion.  Respiratory: Respirations even and unlabored. Lungs clear to auscultation bilaterally.   No wheezes, crackles, or rhonchi.  Cardiovascular: Normal S1, S2. Regular rate and rhythm. No peripheral edema, cyanosis or pallor.  Gastrointestinal:  Soft, nondistended, nontender. No rebound or guarding. Normal bowel sounds. No appreciable masses or hepatomegaly. Rectal:  Not performed.  Msk:  Symmetrical without gross deformities. Without edema, no deformity or joint abnormality.  Neurologic:  Alert and  oriented x4;  grossly normal neurologically.  Skin:   Dry and  intact without significant lesions or rashes.   RELEVANT LABS AND IMAGING: CBC     Latest Ref Rng & Units 10/05/2016    1:24 PM 05/11/2010   10:12 PM  CBC  WBC 4.0 - 10.5 K/uL 11.5  12.8   Hemoglobin 13.0 - 17.0 g/dL 85.1  87.9   Hematocrit 39.0 - 52.0 % 44.1  33.4   Platelets 150 - 400 K/uL 225  330       CMP         Latest Ref Rng & Units 10/05/2016    1:24 PM 05/12/2010    6:19 AM 05/11/2010   10:12 PM  CMP  Glucose 65 - 99 mg/dL 90  883  64   BUN 6 - 20 mg/dL 15  9  13    Creatinine 0.61 - 1.24 mg/dL 9.15  <9.52  <9.52   Sodium 135 - 145 mmol/L 138  134  136   Potassium 3.5 - 5.1 mmol/L 4.0  3.7  4.2   Chloride 101 - 111 mmol/L 105  99  95   CO2 22 - 32 mmol/L 25  25  23    Calcium 8.9 - 10.3 mg/dL 9.7  9.2  89.9   Total Protein 6.5 - 8.1 g/dL 7.8  6.7     Total Bilirubin 0.3 - 1.2 mg/dL 1.6  0.2     Alkaline Phos 38 - 126 U/L 70  115     AST 15 - 41 U/L 24  18     ALT 17 - 63 U/L 13  7     09/08/23 labs show: wbc 7, hgb 15.1, plt 242, BUN 12, Creat 1.09, normal LFTs, H pylroi breath test negative,  03/2023 labs show: TSH 0.967   Assessment:     Encounter Diagnoses  Name Primary?   Abdominal pain, epigastric Yes   Gastroesophageal reflux disease, unspecified whether esophagitis present       23 year old male patient who presents with intermittent dyspepsia not completely relieved with PPI therapy and strict GERD diet.  Negative H. pylori breath test.  Patient would like to proceed with upper GI endoscopy to evaluate and rule out GERD, gastritis, or peptic ulcer disease.  Recommended the patient go back on PPI therapy however he would like to hold off for now. Avoid NSAIDs. Avoid Alcohol. Consider imaging if unremarkable and symptoms continue.   Plan: -Recommend GERD diet, no late meals  -offered restarting Omeprazole , would like to hold off -Schedule EGD in [facility] with Dr.Cobin Cadavid. The risks and  benefits of EGD with possible biopsies and esophageal dilation were discussed with the  patient who agrees to proceed.     Thank you for the courtesy of this consult. Please call me with any questions or concerns.    Deanna May, FNP-C [facility]    Attending physician's note   I have taken history, reviewed the chart and examined the patient. I performed a substantive portion of this encounter, including complete performance of at least one of the key components, in conjunction with the APP. I agree with the Advanced Practitioner's note, impression and recommendations.   EGD today   Anselm Bring, MD [facility] [phone]

## 2023-11-18 NOTE — Patient Instructions (Signed)
 Resume previous diet.  Avoid nonsteroidal anti inflammatory medications/alcohol/stop vaping.   Call clinic with any issues.   YOU HAD AN ENDOSCOPIC PROCEDURE TODAY AT THE [facility]:   Refer to the procedure report that was given to you for any specific questions about what was found during the examination.  If the procedure report does not answer your questions, please call your gastroenterologist to clarify.  If you requested that your care partner not be given the details of your procedure findings, then the procedure report has been included in a sealed envelope for you to review at your convenience later.  YOU SHOULD EXPECT: Some feelings of bloating in the abdomen. Passage of more gas than usual.  Walking can help get rid of the air that was put into your GI tract during the procedure and reduce the bloating. If you had a lower endoscopy (such as a colonoscopy or flexible sigmoidoscopy) you may notice spotting of blood in your stool or on the toilet paper. If you underwent a bowel prep for your procedure, you may not have a normal bowel movement for a few days.  Please Note:  You might notice some irritation and congestion in your nose or some drainage.  This is from the oxygen used during your procedure.  There is no need for concern and it should clear up in a day or so.  SYMPTOMS TO REPORT IMMEDIATELY:  Following upper endoscopy (EGD)  Vomiting of blood or coffee ground material  New chest pain or pain under the shoulder blades  Painful or persistently difficult swallowing  New shortness of breath  Fever of 100F or higher  Black, tarry-looking stools  For urgent or emergent issues, a gastroenterologist can be reached at any hour by calling [phone]. Do not use MyChart messaging for urgent concerns.    DIET:  We do recommend a small meal at first, but then you may proceed to your regular diet.  Drink plenty of fluids but you should avoid alcoholic beverages for  24 hours.  ACTIVITY:  You should plan to take it easy for the rest of today and you should NOT DRIVE or use heavy machinery until tomorrow (because of the sedation medicines used during the test).    FOLLOW UP: Our staff will call the number listed on your records the next business day following your procedure.  We will call around 7:15- 8:00 am to check on you and address any questions or concerns that you may have regarding the information given to you following your procedure. If we do not reach you, we will leave a message.     If any biopsies were taken you will be contacted by phone or by letter within the next 1-3 weeks.  Please call us  at [phone] if you have not heard about the biopsies in 3 weeks.    SIGNATURES/CONFIDENTIALITY: You and/or your care partner have signed paperwork which will be entered into your electronic medical record.  These signatures attest to the fact that that the information above on your After Visit Summary has been reviewed and is understood.  Full responsibility of the confidentiality of this discharge information lies with you and/or your care-partner.

## 2023-11-18 NOTE — Progress Notes (Signed)
 Vss nad trans to pacu

## 2023-11-18 NOTE — Op Note (Signed)
 [facility] Patient Name: Craig Odonnell Procedure Date: 11/18/2023 11:10 AM MRN: [id number] Endoscopist: Lynnie Bring , MD, [id number] Age: 23 Referring MD:  Date of Birth: 06-05-2000 Gender: Male Account #: [id number] Procedure:                Upper GI endoscopy Indications:              GERD. Medicines:                Monitored Anesthesia Care Procedure:                Pre-Anesthesia Assessment:                           - Prior to the procedure, a History and Physical                            was performed, and patient medications and                            allergies were reviewed. The patient's tolerance of                            previous anesthesia was also reviewed. The risks                            and benefits of the procedure and the sedation                            options and risks were discussed with the patient.                            All questions were answered, and informed consent                            was obtained. Prior Anticoagulants: The patient has                            taken no anticoagulant or antiplatelet agents. ASA                            Grade Assessment: I - A normal, healthy patient.                            After reviewing the risks and benefits, the patient                            was deemed in satisfactory condition to undergo the                            procedure.                           After obtaining informed consent, the endoscope was  passed under direct vision. Throughout the                            procedure, the patient's blood pressure, pulse, and                            oxygen saturations were monitored continuously. The                            Olympus Scope [id number] was introduced through the                            mouth, and advanced to the second part of duodenum.                            The upper GI endoscopy was accomplished without                             difficulty. The patient tolerated the procedure                            well. Scope In: Scope Out: Findings:                 The examined esophagus was normal. Biopsies were                            obtained from the proximal and distal esophagus                            with cold forceps for histology of suspected                            eosinophilic esophagitis.                           The Z-line was regular and was found 36 cm from the                            incisors, also examined by narrowband imaging..                           The entire examined stomach was normal. Biopsies                            were taken with a cold forceps for histology.                           The examined duodenum was normal. Biopsies for                            histology were taken with a cold forceps for                            evaluation of celiac  disease. Complications:            No immediate complications. Estimated Blood Loss:     Estimated blood loss: none. Impression:               - Normal EGD. Recommendation:           - Patient has a contact number available for                            emergencies. The signs and symptoms of potential                            delayed complications were discussed with the                            patient. Return to normal activities tomorrow.                            Written discharge instructions were provided to the                            patient.                           - Resume previous diet.                           - Broch GERD                           - Resume omeprazole 20 mg p.o. daily x 4 weeks,                            then PRN.                           - Avoid nonsteroidals/alcohol/stop vaping.                           - Further workup if still with problems.                           - The findings and recommendations were discussed                            with the patient's  family. Lynnie Bring, MD 11/18/2023 11:44:46 AM This report has been signed electronically.

## 2023-11-19 ENCOUNTER — Telehealth: Payer: Self-pay

## 2023-11-19 NOTE — Telephone Encounter (Signed)
 Follow up call to pt, lm for pt to call if having any difficulty with normal activities or eating and drinking.  Also to call if any other questions or concerns.

## 2023-11-23 LAB — SURGICAL PATHOLOGY

## 2023-11-27 ENCOUNTER — Ambulatory Visit: Payer: Self-pay | Admitting: Gastroenterology

## 2023-12-13 ENCOUNTER — Ambulatory Visit
Admission: EM | Admit: 2023-12-13 | Discharge: 2023-12-13 | Disposition: A | Discharge status: Home / Self Care | Attending: Family Medicine | Admitting: Family Medicine

## 2023-12-13 ENCOUNTER — Other Ambulatory Visit: Payer: Self-pay

## 2023-12-13 DIAGNOSIS — B309 Viral conjunctivitis, unspecified: Secondary | ICD-10-CM

## 2023-12-13 DIAGNOSIS — J029 Acute pharyngitis, unspecified: Secondary | ICD-10-CM | POA: Diagnosis not present

## 2023-12-13 DIAGNOSIS — J069 Acute upper respiratory infection, unspecified: Secondary | ICD-10-CM | POA: Diagnosis not present

## 2023-12-13 DIAGNOSIS — R051 Acute cough: Secondary | ICD-10-CM | POA: Diagnosis not present

## 2023-12-13 LAB — POC COVID19/FLU A&B COMBO
Covid Antigen, POC: NEGATIVE
Influenza A Antigen, POC: NEGATIVE
Influenza B Antigen, POC: NEGATIVE

## 2023-12-13 LAB — POCT RAPID STREP A (OFFICE): Rapid Strep A Screen: NEGATIVE

## 2023-12-13 MED ORDER — FLUTICASONE PROPIONATE 50 MCG/ACT NA SUSP: 1.0000 | Freq: Every day | NASAL | 0 refills | Status: AC

## 2023-12-13 MED ORDER — CETIRIZINE HCL 10 MG PO TABS: 10.0000 mg | ORAL_TABLET | Freq: Every day | ORAL | 0 refills | Status: AC

## 2023-12-13 NOTE — Discharge Instructions (Addendum)
 You tested negative for COVID flu and strep throat.  Start cetirizine  allergy medicine daily as well as Flonase  nasal spray daily.  May do salt water gargles and warm liquids such as teas and honey.  You may use over-the-counter antihistamine eyedrops such as Clear Eyes to that right eye.  Lots of rest and fluids.  Please follow-up with your PCP if your symptoms are not improving.  Please go to the emergency room for any worsening symptoms.  Hope you feel better soon!

## 2023-12-13 NOTE — ED Triage Notes (Signed)
 Pt c/o sore throatx1wk. Pt c/o nasal drainage and dry coughx2d.

## 2023-12-13 NOTE — ED Provider Notes (Signed)
 [facility]    CSN: [id number] Arrival date & time: 12/13/23  1039      History   Chief Complaint No chief complaint on file.   HPI Craig Odonnell is a 23 y.o. male  presents for evaluation of URI symptoms for 2 days. Patient reports associated symptoms of cough, congestion x 2 days with sore throat x 1 week.  Also reports 2 days of right eye redness with watery drainage.  Denies N/V/D, fevers, ear pain, body aches, shortness of breath. Patient does not have a hx of asthma. Patient does vape.  Reports no known sick contacts.  Pt has taken DayQuil OTC for symptoms. Pt has no other concerns at this time.   HPI  Past Medical History:  Diagnosis Date   GERD (gastroesophageal reflux disease)     There are no active problems to display for this patient.   Past Surgical History:  Procedure Laterality Date   TONSILLECTOMY         Home Medications    Prior to Admission medications   Medication Sig Start Date End Date Taking? Authorizing Provider  cetirizine  (ZYRTEC ) 10 MG tablet Take 1 tablet (10 mg total) by mouth daily. 12/13/23  Yes Ripken Rekowski, Jodi R, NP  fluticasone  (FLONASE ) 50 MCG/ACT nasal spray Place 1 spray into both nostrils daily. 12/13/23  Yes Liani Caris, Jodi R, NP  imiquimod  (ALDARA ) 5 % cream Apply a thin layer to the area at bedtime. Patient not taking: Reported on 11/18/2023 02/10/23   Alm Delon SAILOR, DO  ketoconazole  (NIZORAL ) 2 % shampoo Apply topically 2 (two) times daily. Patient not taking: Reported on 11/18/2023 06/15/22   [provider]  omeprazole (PRILOSEC) 20 MG capsule Take 20 mg by mouth daily. 08/26/23   [provider]  Safety Seal Miscellaneous MISC Apply 1 Application topically at bedtime. Medication Name: G pen Patient not taking: Reported on 11/18/2023 11/11/22   Alm Delon SAILOR, DO  tretinoin  (RETIN-A ) 0.05 % cream Apply topically at bedtime. Applt 2 nights weekly for 1 week then increase to 3 nights weekly Patient not  taking: Reported on 11/18/2023 02/04/23 02/04/24  Alm Delon SAILOR, DO    Family History Family History  Problem Relation Age of Onset   Cancer Mother     Social History Social History   Tobacco Use   Smoking status: Never    Passive exposure: Never   Smokeless tobacco: Never  Vaping Use   Vaping status: Every Day   Substances: Nicotine, Flavoring  Substance Use Topics   Alcohol use: Yes    Comment: occ   Drug use: No     Allergies   Patient has no known allergies.   Review of Systems Review of Systems  HENT:  Positive for congestion and sore throat.   Eyes:  Positive for redness.  Respiratory:  Positive for cough.      Physical Exam Triage Vital Signs ED Triage Vitals  Encounter Vitals Group     BP 12/13/23 1045 106/61     Girls Systolic BP Percentile --      Girls Diastolic BP Percentile --      Boys Systolic BP Percentile --      Boys Diastolic BP Percentile --      Pulse Rate 12/13/23 1045 62     Resp 12/13/23 1045 16     Temp 12/13/23 1045 99.1 F (37.3 C)     Temp Source 12/13/23 1045 Oral     SpO2 12/13/23 1045 95 %  Weight --      Height --      Head Circumference --      Peak Flow --      Pain Score 12/13/23 1043 8     Pain Loc --      Pain Education --      Exclude from Growth Chart --    No data found.  Updated Vital Signs BP 106/61   Pulse 62   Temp 99.1 F (37.3 C) (Oral)   Resp 16   SpO2 95%   Visual Acuity Right Eye Distance:   Left Eye Distance:   Bilateral Distance:    Right Eye Near:   Left Eye Near:    Bilateral Near:     Physical Exam Vitals and nursing note reviewed.  Constitutional:      General: He is not in acute distress.    Appearance: Normal appearance. He is not ill-appearing or toxic-appearing.  HENT:     Head: Normocephalic and atraumatic.     Right Ear: Tympanic membrane and ear canal normal.     Left Ear: Tympanic membrane and ear canal normal.     Nose: Congestion present.     Mouth/Throat:      Mouth: Mucous membranes are moist.     Pharynx: Posterior oropharyngeal erythema present.  Eyes:     General: Lids are normal.        Right eye: No foreign body, discharge or hordeolum.     Conjunctiva/sclera:     Right eye: Right conjunctiva is injected. No chemosis, exudate or hemorrhage.    Pupils: Pupils are equal, round, and reactive to light.     Comments: Scant watery drainage from right conjunctiva   Cardiovascular:     Rate and Rhythm: Normal rate and regular rhythm.     Heart sounds: Normal heart sounds.  Pulmonary:     Effort: Pulmonary effort is normal.     Breath sounds: Normal breath sounds. No wheezing, rhonchi or rales.  Musculoskeletal:     Cervical back: Normal range of motion and neck supple.  Lymphadenopathy:     Cervical: No cervical adenopathy.  Skin:    General: Skin is warm and dry.  Neurological:     General: No focal deficit present.     Mental Status: He is alert and oriented to person, place, and time.  Psychiatric:        Mood and Affect: Mood normal.        Behavior: Behavior normal.      UC Treatments / Results  Labs (all labs ordered are listed, but only abnormal results are displayed) Labs Reviewed  CULTURE, GROUP A STREP [facility])  POCT RAPID STREP A (OFFICE)  POC COVID19/FLU A&B COMBO    EKG   Radiology No results found.  Procedures Procedures (including critical care time)  Medications Ordered in UC Medications - No data to display  Initial Impression / Assessment and Plan / UC Course  I have reviewed the triage vital signs and the nursing notes.  Pertinent labs & imaging results that were available during my care of the patient were reviewed by me and considered in my medical decision making (see chart for details).     Reviewed exam and symptoms with patient.  Negative COVID, strep, flu testing.  Will send strep throat culture.  Discussed viral illness and symptomatic treatment.  Will do trial of cetirizine  and Flonase   for persistent sore throat.  Discussed viral conjunctivitis and use of  over-the-counter antihistamine eyedrops as needed.  He declined cough medication.  Encourage rest fluids and PCP follow-up if symptoms do not improve.  ER precautions reviewed. Final Clinical Impressions(s) / UC Diagnoses   Final diagnoses:  Sore throat  Acute cough  Viral upper respiratory illness  Viral conjunctivitis of right eye     Discharge Instructions      You tested negative for COVID flu and strep throat.  Start cetirizine  allergy medicine daily as well as Flonase  nasal spray daily.  May do salt water gargles and warm liquids such as teas and honey.  You may use over-the-counter antihistamine eyedrops such as Clear Eyes to that right eye.  Lots of rest and fluids.  Please follow-up with your PCP if your symptoms are not improving.  Please go to the emergency room for any worsening symptoms.  Hope you feel better soon!     ED Prescriptions     Medication Sig Dispense Auth. Provider   fluticasone  (FLONASE ) 50 MCG/ACT nasal spray Place 1 spray into both nostrils daily. 15.8 mL Corliss Coggeshall, Jodi R, NP   cetirizine  (ZYRTEC ) 10 MG tablet Take 1 tablet (10 mg total) by mouth daily. 30 tablet Gaynor Genco, Jodi R, NP      PDMP not reviewed this encounter.   Loreda Myla SAUNDERS, NP 12/13/23 1133

## 2023-12-16 ENCOUNTER — Ambulatory Visit (HOSPITAL_COMMUNITY): Payer: Self-pay

## 2023-12-16 LAB — CULTURE, GROUP A STREP (THRC)
